# Patient Record
Sex: Female | Born: 1940
Health system: Southern US, Community
[De-identification: ages and names within clinical notes are randomized; demographics above are authoritative.]

## PROBLEM LIST (undated history)

## (undated) ENCOUNTER — Emergency Department (HOSPITAL_COMMUNITY): Payer: Medicare PPO | Source: Home / Self Care

## (undated) DIAGNOSIS — I10 Essential (primary) hypertension: Secondary | ICD-10-CM

## (undated) HISTORY — PX: CHOLECYSTECTOMY: SHX55

## (undated) HISTORY — PX: BACK SURGERY: SHX140

## (undated) HISTORY — PX: ABDOMINAL HYSTERECTOMY: SHX81

---

## 1999-02-25 ENCOUNTER — Encounter: Payer: Self-pay | Admitting: Orthopedic Surgery

## 1999-02-28 ENCOUNTER — Ambulatory Visit (HOSPITAL_COMMUNITY): Admission: RE | Admit: 1999-02-28 | Discharge: 1999-02-28 | Payer: Self-pay | Admitting: Orthopedic Surgery

## 2001-07-29 ENCOUNTER — Ambulatory Visit (HOSPITAL_COMMUNITY): Admission: RE | Admit: 2001-07-29 | Discharge: 2001-07-29 | Payer: Self-pay | Admitting: Gastroenterology

## 2003-08-10 ENCOUNTER — Encounter: Admission: RE | Admit: 2003-08-10 | Discharge: 2003-08-10 | Payer: Self-pay | Admitting: Emergency Medicine

## 2010-12-10 ENCOUNTER — Other Ambulatory Visit: Payer: Self-pay | Admitting: Dermatology

## 2011-08-08 ENCOUNTER — Other Ambulatory Visit: Payer: Self-pay | Admitting: Gastroenterology

## 2013-12-23 ENCOUNTER — Other Ambulatory Visit: Payer: Self-pay | Admitting: Dermatology

## 2014-02-28 ENCOUNTER — Other Ambulatory Visit: Payer: Self-pay | Admitting: Dermatology

## 2014-07-04 ENCOUNTER — Other Ambulatory Visit: Payer: Self-pay | Admitting: Family Medicine

## 2014-07-04 DIAGNOSIS — R1084 Generalized abdominal pain: Secondary | ICD-10-CM

## 2014-07-04 DIAGNOSIS — K5790 Diverticulosis of intestine, part unspecified, without perforation or abscess without bleeding: Secondary | ICD-10-CM

## 2014-07-07 ENCOUNTER — Ambulatory Visit
Admission: RE | Admit: 2014-07-07 | Discharge: 2014-07-07 | Disposition: A | Discharge status: Home / Self Care | Payer: BC Managed Care – PPO | Source: Ambulatory Visit | Attending: Family Medicine | Admitting: Family Medicine

## 2014-07-07 DIAGNOSIS — K5790 Diverticulosis of intestine, part unspecified, without perforation or abscess without bleeding: Secondary | ICD-10-CM

## 2014-07-07 DIAGNOSIS — R1084 Generalized abdominal pain: Secondary | ICD-10-CM

## 2016-03-20 DIAGNOSIS — H02021 Mechanical entropion of right upper eyelid: Secondary | ICD-10-CM | POA: Diagnosis not present

## 2016-03-20 DIAGNOSIS — H02051 Trichiasis without entropian right upper eyelid: Secondary | ICD-10-CM | POA: Diagnosis not present

## 2016-03-20 DIAGNOSIS — H04561 Stenosis of right lacrimal punctum: Secondary | ICD-10-CM | POA: Diagnosis not present

## 2017-03-24 DIAGNOSIS — E2839 Other primary ovarian failure: Secondary | ICD-10-CM | POA: Diagnosis not present

## 2017-11-17 DIAGNOSIS — Z23 Encounter for immunization: Secondary | ICD-10-CM | POA: Diagnosis not present

## 2017-11-24 DIAGNOSIS — J309 Allergic rhinitis, unspecified: Secondary | ICD-10-CM | POA: Diagnosis not present

## 2017-12-18 DIAGNOSIS — L309 Dermatitis, unspecified: Secondary | ICD-10-CM | POA: Diagnosis not present

## 2017-12-18 DIAGNOSIS — J309 Allergic rhinitis, unspecified: Secondary | ICD-10-CM | POA: Diagnosis not present

## 2017-12-18 DIAGNOSIS — R5383 Other fatigue: Secondary | ICD-10-CM | POA: Diagnosis not present

## 2017-12-18 DIAGNOSIS — R197 Diarrhea, unspecified: Secondary | ICD-10-CM | POA: Diagnosis not present

## 2017-12-31 DIAGNOSIS — E7800 Pure hypercholesterolemia, unspecified: Secondary | ICD-10-CM | POA: Diagnosis not present

## 2017-12-31 DIAGNOSIS — Z79899 Other long term (current) drug therapy: Secondary | ICD-10-CM | POA: Diagnosis not present

## 2017-12-31 DIAGNOSIS — I890 Lymphedema, not elsewhere classified: Secondary | ICD-10-CM | POA: Diagnosis not present

## 2017-12-31 DIAGNOSIS — N183 Chronic kidney disease, stage 3 (moderate): Secondary | ICD-10-CM | POA: Diagnosis not present

## 2017-12-31 DIAGNOSIS — Z1211 Encounter for screening for malignant neoplasm of colon: Secondary | ICD-10-CM | POA: Diagnosis not present

## 2017-12-31 DIAGNOSIS — I2510 Atherosclerotic heart disease of native coronary artery without angina pectoris: Secondary | ICD-10-CM | POA: Diagnosis not present

## 2017-12-31 DIAGNOSIS — I10 Essential (primary) hypertension: Secondary | ICD-10-CM | POA: Diagnosis not present

## 2017-12-31 DIAGNOSIS — M109 Gout, unspecified: Secondary | ICD-10-CM | POA: Diagnosis not present

## 2017-12-31 DIAGNOSIS — I700 Atherosclerosis of aorta: Secondary | ICD-10-CM | POA: Diagnosis not present

## 2017-12-31 DIAGNOSIS — Z0000 Encounter for general adult medical examination without abnormal findings: Secondary | ICD-10-CM | POA: Diagnosis not present

## 2017-12-31 DIAGNOSIS — Z23 Encounter for immunization: Secondary | ICD-10-CM | POA: Diagnosis not present

## 2017-12-31 DIAGNOSIS — C4491 Basal cell carcinoma of skin, unspecified: Secondary | ICD-10-CM | POA: Diagnosis not present

## 2017-12-31 DIAGNOSIS — D692 Other nonthrombocytopenic purpura: Secondary | ICD-10-CM | POA: Diagnosis not present

## 2018-01-01 DIAGNOSIS — D1721 Benign lipomatous neoplasm of skin and subcutaneous tissue of right arm: Secondary | ICD-10-CM | POA: Diagnosis not present

## 2018-01-01 DIAGNOSIS — Z85828 Personal history of other malignant neoplasm of skin: Secondary | ICD-10-CM | POA: Diagnosis not present

## 2018-01-01 DIAGNOSIS — D2261 Melanocytic nevi of right upper limb, including shoulder: Secondary | ICD-10-CM | POA: Diagnosis not present

## 2018-01-01 DIAGNOSIS — D1722 Benign lipomatous neoplasm of skin and subcutaneous tissue of left arm: Secondary | ICD-10-CM | POA: Diagnosis not present

## 2018-01-01 DIAGNOSIS — D1801 Hemangioma of skin and subcutaneous tissue: Secondary | ICD-10-CM | POA: Diagnosis not present

## 2018-01-01 DIAGNOSIS — L570 Actinic keratosis: Secondary | ICD-10-CM | POA: Diagnosis not present

## 2018-01-01 DIAGNOSIS — D2262 Melanocytic nevi of left upper limb, including shoulder: Secondary | ICD-10-CM | POA: Diagnosis not present

## 2018-01-01 DIAGNOSIS — L821 Other seborrheic keratosis: Secondary | ICD-10-CM | POA: Diagnosis not present

## 2018-01-18 DIAGNOSIS — I10 Essential (primary) hypertension: Secondary | ICD-10-CM | POA: Diagnosis not present

## 2018-08-06 ENCOUNTER — Telehealth (HOSPITAL_COMMUNITY): Payer: Self-pay | Admitting: Emergency Medicine

## 2018-08-06 ENCOUNTER — Ambulatory Visit (HOSPITAL_COMMUNITY)
Admission: EM | Admit: 2018-08-06 | Discharge: 2018-08-06 | Disposition: A | Discharge status: Home / Self Care | Payer: Medicare Other | Attending: Internal Medicine | Admitting: Internal Medicine

## 2018-08-06 ENCOUNTER — Encounter (HOSPITAL_COMMUNITY): Payer: Self-pay

## 2018-08-06 ENCOUNTER — Other Ambulatory Visit: Payer: Self-pay

## 2018-08-06 DIAGNOSIS — H9313 Tinnitus, bilateral: Secondary | ICD-10-CM | POA: Diagnosis not present

## 2018-08-06 HISTORY — DX: Essential (primary) hypertension: I10

## 2018-08-06 MED ORDER — GUAIFENESIN ER 600 MG PO TB12: 1200.0000 mg | ORAL_TABLET | Freq: Two times a day (BID) | ORAL | 0 refills | Status: DC | PRN

## 2018-08-06 MED ORDER — FLUTICASONE PROPIONATE 50 MCG/ACT NA SUSP: 1.0000 | Freq: Every day | NASAL | 2 refills | Status: DC

## 2018-08-06 MED ORDER — GUAIFENESIN ER 600 MG PO TB12: 1200.0000 mg | ORAL_TABLET | Freq: Two times a day (BID) | ORAL | 0 refills | Status: AC | PRN

## 2018-08-06 NOTE — Telephone Encounter (Signed)
PT wants meds sent to [address]

## 2018-08-06 NOTE — ED Provider Notes (Signed)
[facility]    CSN: [id number] Arrival date & time: 08/06/18  1554      History   Chief Complaint Chief Complaint  Patient presents with  . Tinnitus    HPI Elizabeth Beard is a 78 y.o. female.   Elizabeth Beard presents with complaints of "buzzing" "ring" to her head. States last week 6/18 saw her PCP for congestion with ear pressure, eyes watering, nasal drainage, facial pressure and the same buzzing. Was prescribed antibiotics which helped with her symptoms. She was feeling better. This morning the buzzing returned. No further nasal or eye drainage. No ear pain. States sometimes she can cough up/produce phlegm but no specific cough. No fevers. No known ill contacts. She resides alone. No dizziness or weakness. No confusion. No numbness or tingling. No headache. No neck pain. No vision changes. Still on the antibiotics, tomorrow will be the last day. Denies any previous similar. Hx of htn.    ROS per HPI, negative if not otherwise mentioned.      Past Medical History:  Diagnosis Date  . Hypertension     There are no active problems to display for this patient.   Past Surgical History:  Procedure Laterality Date  . ABDOMINAL HYSTERECTOMY    . CHOLECYSTECTOMY      OB History   No obstetric history on file.      Home Medications    Prior to Admission medications   Medication Sig Start Date End Date Taking? Authorizing Provider  allopurinol (ZYLOPRIM) 300 MG tablet Take 300 mg by mouth daily.   Yes [provider]  amLODipine (NORVASC) 5 MG tablet Take 5 mg by mouth daily.   Yes [provider]  aspirin EC 81 MG tablet Take 81 mg by mouth daily.   Yes [provider]  CALCIUM CITRATE-VITAMIN D PO Take 1 tablet by mouth 2 (two) times daily.   Yes [provider]  furosemide (LASIX) 40 MG tablet Take 40 mg by mouth.   Yes [provider]  labetalol (NORMODYNE) 300 MG tablet Take 300 mg by mouth 2 (two) times  daily.   Yes [provider]  potassium chloride SA (K-DUR) 20 MEQ tablet Take 20 mEq by mouth 2 (two) times daily.   Yes [provider]  valsartan-hydrochlorothiazide (DIOVAN-HCT) 320-25 MG tablet Take 1 tablet by mouth daily.   Yes [provider]  fluticasone (FLONASE) 50 MCG/ACT nasal spray Place 1 spray into both nostrils daily. 08/06/18   Zigmund Gottron, NP  guaiFENesin (MUCINEX) 600 MG 12 hr tablet Take 2 tablets (1,200 mg total) by mouth 2 (two) times daily as needed for up to 5 days. 08/06/18 08/11/18  Zigmund Gottron, NP    Family History Family History  Problem Relation Age of Onset  . Cancer Mother   . Heart failure Father     Social History Social History   Tobacco Use  . Smoking status: Never Smoker  . Smokeless tobacco: Never Used  Substance Use Topics  . Alcohol use: Not Currently  . Drug use: Never     Allergies   Patient has no known allergies.   Review of Systems Review of Systems   Physical Exam Triage Vital Signs ED Triage Vitals  Enc Vitals Group     BP 08/06/18 1645 127/76     Pulse Rate 08/06/18 1645 78     Resp 08/06/18 1645 18     Temp 08/06/18 1645 98.4 F (36.9 C)  Temp Source 08/06/18 1645 Oral     SpO2 08/06/18 1645 100 %     Weight --      Height --      Head Circumference --      Peak Flow --      Pain Score 08/06/18 1637 2     Pain Loc --      Pain Edu? --      Excl. in [place]? --    No data found.  Updated Vital Signs BP 127/76 (BP Location: Right Arm)   Pulse 78   Temp 98.4 F (36.9 C) (Oral)   Resp 18   SpO2 100%    Physical Exam Constitutional:      General: She is not in acute distress.    Appearance: She is well-developed.  HENT:     Head: Normocephalic and atraumatic.     Ears:     Comments: Tm's dull bilaterally     Nose: Nose normal.     Mouth/Throat:     Lips: Pink.     Mouth: Mucous membranes are moist.  Neck:     Musculoskeletal: No edema, neck rigidity or pain with  movement.     Vascular: No carotid bruit or JVD.  Cardiovascular:     Rate and Rhythm: Normal rate and regular rhythm.     Heart sounds: Normal heart sounds.  Pulmonary:     Effort: Pulmonary effort is normal.     Breath sounds: Normal breath sounds.  Skin:    General: Skin is warm and dry.  Neurological:     General: No focal deficit present.     Mental Status: She is alert and oriented to person, place, and time.     GCS: GCS eye subscore is 4. GCS verbal subscore is 5. GCS motor subscore is 6.     Cranial Nerves: Cranial nerves are intact.     Sensory: Sensation is intact.     Motor: Motor function is intact.     Coordination: Coordination is intact.     Gait: Gait is intact.      UC Treatments / Results  Labs (all labs ordered are listed, but only abnormal results are displayed) Labs Reviewed - No data to display  EKG None  Radiology No results found.  Procedures Procedures (including critical care time)  Medications Ordered in UC Medications - No data to display  Initial Impression / Assessment and Plan / UC Course  I have reviewed the triage vital signs and the nursing notes.  Pertinent labs & imaging results that were available during my care of the patient were reviewed by me and considered in my medical decision making (see chart for details).     No red flag findings on exam. Vitals stable. Neurologically intact. Symptoms have been improving with sinusitis treatment, until today. Hasn't completed course yet. Will add in daily flonase as well as mucinex to try to further improve what sounds like eustachian tube dysfunction likely? Continue to follow with PCP for persistent symptoms. Return precautions provided. Patient verbalized understanding and agreeable to plan.  Ambulatory out of clinic without difficulty.    Final Clinical Impressions(s) / UC Diagnoses   Final diagnoses:  Tinnitus aurium, bilateral     Discharge Instructions     Your exam here  today is overall reassuring.  Complete antibiotics prescribed by your physician.  We will try to further treat congestion to try to improve your symptoms.  May use a mucinex to  loosen secretions.  Daily nasal spray will target the sinuses and ear canals as well to hopefully help with symptoms.  Plenty of water.  Continue to follow with your PCP for persistent symptoms.  If any worsening of symptoms: vision changes, dizziness, headache, weakness, numbness, tingling, confusion or otherwise worsening please return or go to the ER.    ED Prescriptions    Medication Sig Dispense Auth. Provider   guaiFENesin (MUCINEX) 600 MG 12 hr tablet Take 2 tablets (1,200 mg total) by mouth 2 (two) times daily as needed for up to 5 days. 20 tablet Augusto Gamble B, NP   fluticasone (FLONASE) 50 MCG/ACT nasal spray Place 1 spray into both nostrils daily. 16 g Zigmund Gottron, NP     Controlled Substance Prescriptions [place] Controlled Substance Registry consulted? Not Applicable   Zigmund Gottron, NP 08/06/18 2002

## 2018-08-06 NOTE — Discharge Instructions (Signed)
Your exam here today is overall reassuring.  Complete antibiotics prescribed by your physician.  We will try to further treat congestion to try to improve your symptoms.  May use a mucinex to loosen secretions.  Daily nasal spray will target the sinuses and ear canals as well to hopefully help with symptoms.  Plenty of water.  Continue to follow with your PCP for persistent symptoms.  If any worsening of symptoms: vision changes, dizziness, headache, weakness, numbness, tingling, confusion or otherwise worsening please return or go to the ER.

## 2018-08-06 NOTE — ED Triage Notes (Signed)
Patient presents to Urgent Care with complaints of consistent ringing in her ears and pain in her face since last week. Patient reports she saw her PCP last week and was diagnosed with sinusitis, headache, and tinnitus and given amoxicillin (still has 2 more doses to take). Pt tried to get in with PCP today but they could not get her in, was told to come here.

## 2019-01-07 ENCOUNTER — Other Ambulatory Visit: Payer: Self-pay

## 2019-01-07 ENCOUNTER — Emergency Department (HOSPITAL_COMMUNITY)
Admission: EM | Admit: 2019-01-07 | Discharge: 2019-01-07 | Disposition: A | Discharge status: Home / Self Care | Payer: Medicare Other | Attending: Emergency Medicine | Admitting: Emergency Medicine

## 2019-01-07 DIAGNOSIS — I10 Essential (primary) hypertension: Secondary | ICD-10-CM | POA: Diagnosis not present

## 2019-01-07 DIAGNOSIS — Y33XXXA Other specified events, undetermined intent, initial encounter: Secondary | ICD-10-CM | POA: Diagnosis not present

## 2019-01-07 DIAGNOSIS — Y999 Unspecified external cause status: Secondary | ICD-10-CM | POA: Insufficient documentation

## 2019-01-07 DIAGNOSIS — Y9289 Other specified places as the place of occurrence of the external cause: Secondary | ICD-10-CM | POA: Insufficient documentation

## 2019-01-07 DIAGNOSIS — Y9389 Activity, other specified: Secondary | ICD-10-CM | POA: Diagnosis not present

## 2019-01-07 DIAGNOSIS — T18128A Food in esophagus causing other injury, initial encounter: Secondary | ICD-10-CM | POA: Diagnosis not present

## 2019-01-07 DIAGNOSIS — Z79899 Other long term (current) drug therapy: Secondary | ICD-10-CM | POA: Diagnosis not present

## 2019-01-07 NOTE — ED Triage Notes (Signed)
Pt presents to ED via [facility] coming from home. Pt was eating left over thanksgiving dinner and became choked on her [place]. She still feels like its stuck in her throat. Pt has no obvious airway problems and denies any ShOB. Pt is CA&Ox4 and ambulatory.

## 2019-01-07 NOTE — ED Provider Notes (Signed)
[facility] DEPT Provider Note   CSN: [id number] Arrival date & time: 01/07/19  1940     History   Chief Complaint Chief Complaint  Patient presents with  . Choking    [place] stuck in throat    HPI Elizabeth Beard is a 78 y.o. female.     HPI  78 year old female presents after getting [place] stuck in her throat.  She was eating [place] around 7 PM and felt it suddenly stuck.  Felt like she was able to get a small piece out but otherwise still feels like it is stuck in her throat.  No vomiting or trouble breathing but she cannot swallow.  There is no chest pain.  No recent illness.  This is never happened to her before.  Past Medical History:  Diagnosis Date  . Hypertension     There are no active problems to display for this patient.   Past Surgical History:  Procedure Laterality Date  . ABDOMINAL HYSTERECTOMY    . CHOLECYSTECTOMY       OB History   No obstetric history on file.      Home Medications    Prior to Admission medications   Medication Sig Start Date End Date Taking? Authorizing Provider  allopurinol (ZYLOPRIM) 300 MG tablet Take 300 mg by mouth daily.   Yes [provider]  amLODipine (NORVASC) 5 MG tablet Take 5 mg by mouth daily.   Yes [provider]  aspirin EC 81 MG tablet Take 81 mg by mouth daily.   Yes [provider]  CALCIUM CITRATE-VITAMIN D PO Take 1 tablet by mouth 2 (two) times daily.   Yes [provider]  fluticasone (FLONASE) 50 MCG/ACT nasal spray Place 1 spray into both nostrils daily. 08/06/18  Yes Burky, Lanelle Bal B, NP  furosemide (LASIX) 40 MG tablet Take 40 mg by mouth.   Yes [provider]  labetalol (NORMODYNE) 300 MG tablet Take 300 mg by mouth 2 (two) times daily.   Yes [provider]  potassium chloride SA (K-DUR) 20 MEQ tablet Take 20 mEq by mouth 2 (two) times daily.   Yes [provider]  valsartan-hydrochlorothiazide  (DIOVAN-HCT) 320-25 MG tablet Take 1 tablet by mouth daily.   Yes [provider]    Family History Family History  Problem Relation Age of Onset  . Cancer Mother   . Heart failure Father     Social History Social History   Tobacco Use  . Smoking status: Never Smoker  . Smokeless tobacco: Never Used  Substance Use Topics  . Alcohol use: Not Currently  . Drug use: Never     Allergies   Patient has no known allergies.   Review of Systems Review of Systems  HENT: Positive for trouble swallowing.   Respiratory: Negative for shortness of breath.   Cardiovascular: Negative for chest pain.  Gastrointestinal: Positive for vomiting. Negative for abdominal pain.  All other systems reviewed and are negative.    Physical Exam Updated Vital Signs BP 101/77   Pulse 72   Temp 98 F (36.7 C) (Oral)   Resp 16   Ht 5\' 2"  (1.575 m)   Wt 68 kg   SpO2 98%   BMI 27.44 kg/m   Physical Exam Vitals signs and nursing note reviewed.  Constitutional:      Appearance: She is well-developed.     Comments: Patient is awake, alert, no acute distress. No drooling or trouble breathing/speaking She vomits immediately  after trying to drink a sip of water  HENT:     Head: Normocephalic and atraumatic.     Right Ear: External ear normal.     Left Ear: External ear normal.     Nose: Nose normal.     Mouth/Throat:     Pharynx: Oropharynx is clear. No oropharyngeal exudate or posterior oropharyngeal erythema.  Eyes:     General:        Right eye: No discharge.        Left eye: No discharge.  Cardiovascular:     Rate and Rhythm: Normal rate and regular rhythm.     Heart sounds: Normal heart sounds.  Pulmonary:     Effort: Pulmonary effort is normal.     Breath sounds: Normal breath sounds.  Abdominal:     Palpations: Abdomen is soft.     Tenderness: There is no abdominal tenderness.  Skin:    General: Skin is warm and dry.  Neurological:     Mental Status: She is alert.   Psychiatric:        Mood and Affect: Mood is not anxious.      ED Treatments / Results  Labs (all labs ordered are listed, but only abnormal results are displayed) Labs Reviewed - No data to display  EKG None  Radiology No results found.  Procedures Procedures (including critical care time)  Medications Ordered in ED Medications - No data to display   Initial Impression / Assessment and Plan / ED Course  I have reviewed the triage vital signs and the nursing notes.  Pertinent labs & imaging results that were available during my care of the patient were reviewed by me and considered in my medical decision making (see chart for details).        Patient presents with food impaction from [place].  Discussed with Dr. Watt Climes, original plan for endoscopy after Covid testing.  However right before she was going to get swabbed, the patient felt a sensation of passing the blockage.  She is now able to drink water and was briefly observed with no emesis or uncomfortable sensation.  Appears she has passed her impaction.  We will have her follow-up with GI as an outpatient but she otherwise appears stable for discharge home.  Final Clinical Impressions(s) / ED Diagnoses   Final diagnoses:  Food impaction of esophagus, initial encounter    ED Discharge Orders    None       Sherwood Gambler, MD 01/07/19 2126

## 2019-01-07 NOTE — Discharge Instructions (Signed)
For now, be careful with eating and you should eat soft food.  Follow-up with gastroenterology.  If you develop recurrent feeling that food is stuck or cannot swallow fluids, you should return to the ER for evaluation.

## 2019-01-18 DIAGNOSIS — Z85828 Personal history of other malignant neoplasm of skin: Secondary | ICD-10-CM | POA: Diagnosis not present

## 2019-01-18 DIAGNOSIS — D1801 Hemangioma of skin and subcutaneous tissue: Secondary | ICD-10-CM | POA: Diagnosis not present

## 2019-01-18 DIAGNOSIS — D225 Melanocytic nevi of trunk: Secondary | ICD-10-CM | POA: Diagnosis not present

## 2019-01-18 DIAGNOSIS — D2262 Melanocytic nevi of left upper limb, including shoulder: Secondary | ICD-10-CM | POA: Diagnosis not present

## 2019-01-18 DIAGNOSIS — L820 Inflamed seborrheic keratosis: Secondary | ICD-10-CM | POA: Diagnosis not present

## 2019-01-18 DIAGNOSIS — L821 Other seborrheic keratosis: Secondary | ICD-10-CM | POA: Diagnosis not present

## 2019-01-18 DIAGNOSIS — D2371 Other benign neoplasm of skin of right lower limb, including hip: Secondary | ICD-10-CM | POA: Diagnosis not present

## 2019-01-18 DIAGNOSIS — L814 Other melanin hyperpigmentation: Secondary | ICD-10-CM | POA: Diagnosis not present

## 2019-01-18 DIAGNOSIS — L570 Actinic keratosis: Secondary | ICD-10-CM | POA: Diagnosis not present

## 2019-06-27 DIAGNOSIS — L821 Other seborrheic keratosis: Secondary | ICD-10-CM | POA: Diagnosis not present

## 2019-06-27 DIAGNOSIS — D179 Benign lipomatous neoplasm, unspecified: Secondary | ICD-10-CM | POA: Diagnosis not present

## 2019-07-04 DIAGNOSIS — H02831 Dermatochalasis of right upper eyelid: Secondary | ICD-10-CM | POA: Diagnosis not present

## 2019-07-04 DIAGNOSIS — H2513 Age-related nuclear cataract, bilateral: Secondary | ICD-10-CM | POA: Diagnosis not present

## 2019-07-04 DIAGNOSIS — H02834 Dermatochalasis of left upper eyelid: Secondary | ICD-10-CM | POA: Diagnosis not present

## 2019-07-04 DIAGNOSIS — H5703 Miosis: Secondary | ICD-10-CM | POA: Diagnosis not present

## 2019-07-12 DIAGNOSIS — D225 Melanocytic nevi of trunk: Secondary | ICD-10-CM | POA: Diagnosis not present

## 2019-07-12 DIAGNOSIS — Z85828 Personal history of other malignant neoplasm of skin: Secondary | ICD-10-CM | POA: Diagnosis not present

## 2019-07-12 DIAGNOSIS — L821 Other seborrheic keratosis: Secondary | ICD-10-CM | POA: Diagnosis not present

## 2019-09-20 DIAGNOSIS — N6320 Unspecified lump in the left breast, unspecified quadrant: Secondary | ICD-10-CM | POA: Diagnosis not present

## 2019-09-20 DIAGNOSIS — L821 Other seborrheic keratosis: Secondary | ICD-10-CM | POA: Diagnosis not present

## 2019-09-20 DIAGNOSIS — D179 Benign lipomatous neoplasm, unspecified: Secondary | ICD-10-CM | POA: Diagnosis not present

## 2019-09-25 DIAGNOSIS — H9313 Tinnitus, bilateral: Secondary | ICD-10-CM | POA: Diagnosis not present

## 2019-10-04 ENCOUNTER — Emergency Department (HOSPITAL_COMMUNITY)
Admission: EM | Admit: 2019-10-04 | Discharge: 2019-10-04 | Disposition: A | Discharge status: Home / Self Care | Payer: Medicare PPO | Attending: Emergency Medicine | Admitting: Emergency Medicine

## 2019-10-04 ENCOUNTER — Other Ambulatory Visit: Payer: Self-pay

## 2019-10-04 ENCOUNTER — Encounter (HOSPITAL_COMMUNITY): Payer: Self-pay

## 2019-10-04 DIAGNOSIS — I10 Essential (primary) hypertension: Secondary | ICD-10-CM | POA: Diagnosis not present

## 2019-10-04 DIAGNOSIS — Z79899 Other long term (current) drug therapy: Secondary | ICD-10-CM | POA: Diagnosis not present

## 2019-10-04 DIAGNOSIS — Z7982 Long term (current) use of aspirin: Secondary | ICD-10-CM | POA: Diagnosis not present

## 2019-10-04 DIAGNOSIS — H9313 Tinnitus, bilateral: Secondary | ICD-10-CM | POA: Diagnosis not present

## 2019-10-04 NOTE — ED Triage Notes (Signed)
Pt presents with c/o ringing and buzzing in her ear. Pt reports she has been seen for same but that the buzzing has gotten worse. Pt reports mild dizziness.

## 2019-10-04 NOTE — ED Provider Notes (Signed)
[facility] DEPT Provider Note   CSN: [id number] Arrival date & time: 10/04/19  0744     History Chief Complaint  Patient presents with  . Tinnitus    Elizabeth Beard is a 79 y.o. female.  79 year old female with history of hypertension who presents with ringing in ears.  Patient has history of tinnitus which she describes as a buzzing in her ears for which she has seen ENT last year.  Was diagnosed with b/l SNHL and offered the option of hearing aids but she states that her hearing has been okay so she has not gotten them.  This morning she was awakened from sleep with a buzzing in bilateral ears that was the same quality as it usually is but seemed louder/more intense than usual and radiated up to the top of her head.  She denies associated headache, vertigo, nausea/vomiting, vision changes, extremity weakness/numbness, or balance problems.  Episode lasted approximately 30 minutes and has now subsided.  She denies any ear pain or sudden hearing loss.  She reports that she actually has an appointment scheduled with ENT in 2 days.  The history is provided by the patient.       Past Medical History:  Diagnosis Date  . Hypertension     There are no problems to display for this patient.   Past Surgical History:  Procedure Laterality Date  . ABDOMINAL HYSTERECTOMY    . CHOLECYSTECTOMY       OB History   No obstetric history on file.     Family History  Problem Relation Age of Onset  . Cancer Mother   . Heart failure Father     Social History   Tobacco Use  . Smoking status: Never Smoker  . Smokeless tobacco: Never Used  Vaping Use  . Vaping Use: Never used  Substance Use Topics  . Alcohol use: Not Currently  . Drug use: Never    Home Medications Prior to Admission medications   Medication Sig Start Date End Date Taking? Authorizing Provider  allopurinol (ZYLOPRIM) 300 MG tablet Take 300 mg by mouth daily.    [provider]  amLODipine (NORVASC) 5 MG tablet Take 5 mg by mouth daily.    [provider]  aspirin EC 81 MG tablet Take 81 mg by mouth daily.    [provider]  CALCIUM CITRATE-VITAMIN D PO Take 1 tablet by mouth 2 (two) times daily.    [provider]  fluticasone (FLONASE) 50 MCG/ACT nasal spray Place 1 spray into both nostrils daily. 08/06/18   Zigmund Gottron, NP  furosemide (LASIX) 40 MG tablet Take 40 mg by mouth.    [provider]  labetalol (NORMODYNE) 300 MG tablet Take 300 mg by mouth 2 (two) times daily.    [provider]  potassium chloride SA (K-DUR) 20 MEQ tablet Take 20 mEq by mouth 2 (two) times daily.    [provider]  valsartan-hydrochlorothiazide (DIOVAN-HCT) 320-25 MG tablet Take 1 tablet by mouth daily.    [provider]    Allergies    Patient has no known allergies.  Review of Systems   Review of Systems All other systems reviewed and are negative except that which was mentioned in HPI  Physical Exam Updated Vital Signs BP (!) 147/94 (BP Location: Right Arm)   Pulse 75   Temp 97.9 F (36.6 C) (Oral)   Resp 16   Ht 5\' 1"  (1.549 m)   Wt  66.7 kg   SpO2 100%   BMI 27.78 kg/m   Physical Exam Vitals and nursing note reviewed.  Constitutional:      General: She is not in acute distress.    Appearance: She is well-developed.     Comments: Awake, alert  HENT:     Head: Normocephalic and atraumatic.     Right Ear: Tympanic membrane and ear canal normal.     Left Ear: Tympanic membrane and ear canal normal.  Eyes:     Extraocular Movements: Extraocular movements intact.     Conjunctiva/sclera: Conjunctivae normal.     Pupils: Pupils are equal, round, and reactive to light.  Pulmonary:     Effort: Pulmonary effort is normal.  Musculoskeletal:     Cervical back: Neck supple.  Skin:    General: Skin is warm and dry.  Neurological:     Mental Status: She is alert and oriented to  person, place, and time.     Cranial Nerves: No cranial nerve deficit.     Motor: No weakness or abnormal muscle tone.     Deep Tendon Reflexes: Reflexes are normal and symmetric. Reflexes normal.     Comments: Fluent speech, normal finger-to-nose testing, negative pronator drift 5/5 strength and normal sensation x all 4 extremities  Psychiatric:        Thought Content: Thought content normal.        Judgment: Judgment normal.     ED Results / Procedures / Treatments   Labs (all labs ordered are listed, but only abnormal results are displayed) Labs Reviewed - No data to display  EKG None  Radiology No results found.  Procedures Procedures (including critical care time)  Medications Ordered in ED Medications - No data to display  ED Course  I have reviewed the triage vital signs and the nursing notes.      MDM Rules/Calculators/A&P                          Well-appearing and normal neurologic exam.  Currently not having symptoms.  I reviewed her chart including ENT and audiology evaluations from last year for the same symptoms.  She has no new symptoms/red flag symptoms to suggest central process such as dissection.  I have encouraged her to follow-up with ENT and have reviewed return precautions.  She voiced understanding. Final Clinical Impression(s) / ED Diagnoses Final diagnoses:  Tinnitus of both ears    Rx / DC Orders ED Discharge Orders    None       Kiyoto Slomski, Wenda Overland, MD 10/04/19 1012

## 2020-01-18 DIAGNOSIS — B078 Other viral warts: Secondary | ICD-10-CM | POA: Diagnosis not present

## 2020-01-18 DIAGNOSIS — Z85828 Personal history of other malignant neoplasm of skin: Secondary | ICD-10-CM | POA: Diagnosis not present

## 2020-01-18 DIAGNOSIS — L814 Other melanin hyperpigmentation: Secondary | ICD-10-CM | POA: Diagnosis not present

## 2020-01-18 DIAGNOSIS — D485 Neoplasm of uncertain behavior of skin: Secondary | ICD-10-CM | POA: Diagnosis not present

## 2020-01-18 DIAGNOSIS — D225 Melanocytic nevi of trunk: Secondary | ICD-10-CM | POA: Diagnosis not present

## 2020-01-18 DIAGNOSIS — D2371 Other benign neoplasm of skin of right lower limb, including hip: Secondary | ICD-10-CM | POA: Diagnosis not present

## 2020-01-18 DIAGNOSIS — L821 Other seborrheic keratosis: Secondary | ICD-10-CM | POA: Diagnosis not present

## 2020-03-13 DIAGNOSIS — M109 Gout, unspecified: Secondary | ICD-10-CM | POA: Diagnosis not present

## 2020-03-13 DIAGNOSIS — E2839 Other primary ovarian failure: Secondary | ICD-10-CM | POA: Diagnosis not present

## 2020-03-13 DIAGNOSIS — C4491 Basal cell carcinoma of skin, unspecified: Secondary | ICD-10-CM | POA: Diagnosis not present

## 2020-03-13 DIAGNOSIS — Z23 Encounter for immunization: Secondary | ICD-10-CM | POA: Diagnosis not present

## 2020-03-13 DIAGNOSIS — Z79899 Other long term (current) drug therapy: Secondary | ICD-10-CM | POA: Diagnosis not present

## 2020-03-13 DIAGNOSIS — E7800 Pure hypercholesterolemia, unspecified: Secondary | ICD-10-CM | POA: Diagnosis not present

## 2020-03-13 DIAGNOSIS — Z0000 Encounter for general adult medical examination without abnormal findings: Secondary | ICD-10-CM | POA: Diagnosis not present

## 2020-03-13 DIAGNOSIS — N183 Chronic kidney disease, stage 3 unspecified: Secondary | ICD-10-CM | POA: Diagnosis not present

## 2020-03-13 DIAGNOSIS — I10 Essential (primary) hypertension: Secondary | ICD-10-CM | POA: Diagnosis not present

## 2020-03-26 DIAGNOSIS — Z1231 Encounter for screening mammogram for malignant neoplasm of breast: Secondary | ICD-10-CM | POA: Diagnosis not present

## 2020-03-26 DIAGNOSIS — Z780 Asymptomatic menopausal state: Secondary | ICD-10-CM | POA: Diagnosis not present

## 2020-05-03 DIAGNOSIS — S60450A Superficial foreign body of right index finger, initial encounter: Secondary | ICD-10-CM | POA: Diagnosis not present

## 2020-05-03 DIAGNOSIS — Y93H2 Activity, gardening and landscaping: Secondary | ICD-10-CM | POA: Diagnosis not present

## 2020-05-08 DIAGNOSIS — L821 Other seborrheic keratosis: Secondary | ICD-10-CM | POA: Diagnosis not present

## 2020-05-08 DIAGNOSIS — I781 Nevus, non-neoplastic: Secondary | ICD-10-CM | POA: Diagnosis not present

## 2020-05-08 DIAGNOSIS — D1801 Hemangioma of skin and subcutaneous tissue: Secondary | ICD-10-CM | POA: Diagnosis not present

## 2020-07-04 DIAGNOSIS — H2513 Age-related nuclear cataract, bilateral: Secondary | ICD-10-CM | POA: Diagnosis not present

## 2020-07-04 DIAGNOSIS — H5703 Miosis: Secondary | ICD-10-CM | POA: Diagnosis not present

## 2020-07-04 DIAGNOSIS — H04123 Dry eye syndrome of bilateral lacrimal glands: Secondary | ICD-10-CM | POA: Diagnosis not present

## 2020-07-04 DIAGNOSIS — H43393 Other vitreous opacities, bilateral: Secondary | ICD-10-CM | POA: Diagnosis not present

## 2020-07-04 DIAGNOSIS — H02831 Dermatochalasis of right upper eyelid: Secondary | ICD-10-CM | POA: Diagnosis not present

## 2020-07-04 DIAGNOSIS — H02834 Dermatochalasis of left upper eyelid: Secondary | ICD-10-CM | POA: Diagnosis not present

## 2020-09-05 DIAGNOSIS — L299 Pruritus, unspecified: Secondary | ICD-10-CM | POA: Diagnosis not present

## 2020-09-10 DIAGNOSIS — G5702 Lesion of sciatic nerve, left lower limb: Secondary | ICD-10-CM | POA: Diagnosis not present

## 2020-09-10 DIAGNOSIS — M25512 Pain in left shoulder: Secondary | ICD-10-CM | POA: Diagnosis not present

## 2020-09-10 DIAGNOSIS — I10 Essential (primary) hypertension: Secondary | ICD-10-CM | POA: Diagnosis not present

## 2020-09-28 DIAGNOSIS — M722 Plantar fascial fibromatosis: Secondary | ICD-10-CM | POA: Diagnosis not present

## 2020-09-28 DIAGNOSIS — I890 Lymphedema, not elsewhere classified: Secondary | ICD-10-CM | POA: Diagnosis not present

## 2020-11-24 DIAGNOSIS — D1801 Hemangioma of skin and subcutaneous tissue: Secondary | ICD-10-CM | POA: Diagnosis not present

## 2020-11-24 DIAGNOSIS — L570 Actinic keratosis: Secondary | ICD-10-CM | POA: Diagnosis not present

## 2020-11-27 DIAGNOSIS — D1721 Benign lipomatous neoplasm of skin and subcutaneous tissue of right arm: Secondary | ICD-10-CM | POA: Diagnosis not present

## 2020-11-27 DIAGNOSIS — D1722 Benign lipomatous neoplasm of skin and subcutaneous tissue of left arm: Secondary | ICD-10-CM | POA: Diagnosis not present

## 2020-11-27 DIAGNOSIS — D225 Melanocytic nevi of trunk: Secondary | ICD-10-CM | POA: Diagnosis not present

## 2020-11-27 DIAGNOSIS — Z85828 Personal history of other malignant neoplasm of skin: Secondary | ICD-10-CM | POA: Diagnosis not present

## 2020-11-27 DIAGNOSIS — L821 Other seborrheic keratosis: Secondary | ICD-10-CM | POA: Diagnosis not present

## 2020-11-27 DIAGNOSIS — B078 Other viral warts: Secondary | ICD-10-CM | POA: Diagnosis not present

## 2020-12-07 ENCOUNTER — Other Ambulatory Visit: Payer: Self-pay | Admitting: Family Medicine

## 2020-12-07 ENCOUNTER — Other Ambulatory Visit: Payer: Self-pay

## 2020-12-07 ENCOUNTER — Ambulatory Visit
Admission: RE | Admit: 2020-12-07 | Discharge: 2020-12-07 | Disposition: A | Discharge status: Home / Self Care | Payer: Medicare PPO | Source: Ambulatory Visit | Attending: Family Medicine | Admitting: Family Medicine

## 2020-12-07 DIAGNOSIS — L309 Dermatitis, unspecified: Secondary | ICD-10-CM | POA: Diagnosis not present

## 2020-12-07 DIAGNOSIS — R6889 Other general symptoms and signs: Secondary | ICD-10-CM

## 2020-12-07 DIAGNOSIS — R220 Localized swelling, mass and lump, head: Secondary | ICD-10-CM | POA: Diagnosis not present

## 2020-12-07 DIAGNOSIS — R930 Abnormal findings on diagnostic imaging of skull and head, not elsewhere classified: Secondary | ICD-10-CM | POA: Diagnosis not present

## 2020-12-07 DIAGNOSIS — D1720 Benign lipomatous neoplasm of skin and subcutaneous tissue of unspecified limb: Secondary | ICD-10-CM | POA: Diagnosis not present

## 2021-01-17 DIAGNOSIS — D1722 Benign lipomatous neoplasm of skin and subcutaneous tissue of left arm: Secondary | ICD-10-CM | POA: Diagnosis not present

## 2021-01-17 DIAGNOSIS — D1721 Benign lipomatous neoplasm of skin and subcutaneous tissue of right arm: Secondary | ICD-10-CM | POA: Diagnosis not present

## 2021-01-17 DIAGNOSIS — L821 Other seborrheic keratosis: Secondary | ICD-10-CM | POA: Diagnosis not present

## 2021-01-17 DIAGNOSIS — L814 Other melanin hyperpigmentation: Secondary | ICD-10-CM | POA: Diagnosis not present

## 2021-01-17 DIAGNOSIS — D2371 Other benign neoplasm of skin of right lower limb, including hip: Secondary | ICD-10-CM | POA: Diagnosis not present

## 2021-01-17 DIAGNOSIS — Z85828 Personal history of other malignant neoplasm of skin: Secondary | ICD-10-CM | POA: Diagnosis not present

## 2021-03-13 DIAGNOSIS — M109 Gout, unspecified: Secondary | ICD-10-CM | POA: Diagnosis not present

## 2021-03-13 DIAGNOSIS — E559 Vitamin D deficiency, unspecified: Secondary | ICD-10-CM | POA: Diagnosis not present

## 2021-03-13 DIAGNOSIS — Z79899 Other long term (current) drug therapy: Secondary | ICD-10-CM | POA: Diagnosis not present

## 2021-03-13 DIAGNOSIS — R739 Hyperglycemia, unspecified: Secondary | ICD-10-CM | POA: Diagnosis not present

## 2021-03-13 DIAGNOSIS — E7800 Pure hypercholesterolemia, unspecified: Secondary | ICD-10-CM | POA: Diagnosis not present

## 2021-03-13 DIAGNOSIS — E8881 Metabolic syndrome: Secondary | ICD-10-CM | POA: Diagnosis not present

## 2021-03-15 DIAGNOSIS — E559 Vitamin D deficiency, unspecified: Secondary | ICD-10-CM | POA: Diagnosis not present

## 2021-03-15 DIAGNOSIS — Z79899 Other long term (current) drug therapy: Secondary | ICD-10-CM | POA: Diagnosis not present

## 2021-03-15 DIAGNOSIS — I10 Essential (primary) hypertension: Secondary | ICD-10-CM | POA: Diagnosis not present

## 2021-03-15 DIAGNOSIS — E8881 Metabolic syndrome: Secondary | ICD-10-CM | POA: Diagnosis not present

## 2021-03-15 DIAGNOSIS — E7800 Pure hypercholesterolemia, unspecified: Secondary | ICD-10-CM | POA: Diagnosis not present

## 2021-03-15 DIAGNOSIS — I2510 Atherosclerotic heart disease of native coronary artery without angina pectoris: Secondary | ICD-10-CM | POA: Diagnosis not present

## 2021-03-15 DIAGNOSIS — Z1211 Encounter for screening for malignant neoplasm of colon: Secondary | ICD-10-CM | POA: Diagnosis not present

## 2021-03-15 DIAGNOSIS — Z0000 Encounter for general adult medical examination without abnormal findings: Secondary | ICD-10-CM | POA: Diagnosis not present

## 2021-03-15 DIAGNOSIS — M109 Gout, unspecified: Secondary | ICD-10-CM | POA: Diagnosis not present

## 2021-03-18 DIAGNOSIS — Z1211 Encounter for screening for malignant neoplasm of colon: Secondary | ICD-10-CM | POA: Diagnosis not present

## 2021-03-22 DIAGNOSIS — L309 Dermatitis, unspecified: Secondary | ICD-10-CM | POA: Diagnosis not present

## 2021-04-01 DIAGNOSIS — Z1231 Encounter for screening mammogram for malignant neoplasm of breast: Secondary | ICD-10-CM | POA: Diagnosis not present

## 2021-04-27 DIAGNOSIS — R229 Localized swelling, mass and lump, unspecified: Secondary | ICD-10-CM | POA: Diagnosis not present

## 2021-04-27 DIAGNOSIS — T148XXA Other injury of unspecified body region, initial encounter: Secondary | ICD-10-CM | POA: Diagnosis not present

## 2021-06-12 DIAGNOSIS — D1722 Benign lipomatous neoplasm of skin and subcutaneous tissue of left arm: Secondary | ICD-10-CM | POA: Diagnosis not present

## 2021-06-12 DIAGNOSIS — D1721 Benign lipomatous neoplasm of skin and subcutaneous tissue of right arm: Secondary | ICD-10-CM | POA: Diagnosis not present

## 2021-06-12 DIAGNOSIS — Z85828 Personal history of other malignant neoplasm of skin: Secondary | ICD-10-CM | POA: Diagnosis not present

## 2021-06-18 DIAGNOSIS — K5909 Other constipation: Secondary | ICD-10-CM | POA: Diagnosis not present

## 2021-07-10 DIAGNOSIS — H04123 Dry eye syndrome of bilateral lacrimal glands: Secondary | ICD-10-CM | POA: Diagnosis not present

## 2021-07-10 DIAGNOSIS — H02834 Dermatochalasis of left upper eyelid: Secondary | ICD-10-CM | POA: Diagnosis not present

## 2021-07-10 DIAGNOSIS — H02831 Dermatochalasis of right upper eyelid: Secondary | ICD-10-CM | POA: Diagnosis not present

## 2021-07-10 DIAGNOSIS — H25813 Combined forms of age-related cataract, bilateral: Secondary | ICD-10-CM | POA: Diagnosis not present

## 2021-07-10 DIAGNOSIS — H43393 Other vitreous opacities, bilateral: Secondary | ICD-10-CM | POA: Diagnosis not present

## 2021-07-10 DIAGNOSIS — H5703 Miosis: Secondary | ICD-10-CM | POA: Diagnosis not present

## 2021-12-19 DIAGNOSIS — I10 Essential (primary) hypertension: Secondary | ICD-10-CM | POA: Diagnosis not present

## 2021-12-19 DIAGNOSIS — M5440 Lumbago with sciatica, unspecified side: Secondary | ICD-10-CM | POA: Diagnosis not present

## 2021-12-26 DIAGNOSIS — I10 Essential (primary) hypertension: Secondary | ICD-10-CM | POA: Diagnosis not present

## 2021-12-31 DIAGNOSIS — R151 Fecal smearing: Secondary | ICD-10-CM | POA: Diagnosis not present

## 2022-01-22 DIAGNOSIS — D2371 Other benign neoplasm of skin of right lower limb, including hip: Secondary | ICD-10-CM | POA: Diagnosis not present

## 2022-01-22 DIAGNOSIS — D1721 Benign lipomatous neoplasm of skin and subcutaneous tissue of right arm: Secondary | ICD-10-CM | POA: Diagnosis not present

## 2022-01-22 DIAGNOSIS — L814 Other melanin hyperpigmentation: Secondary | ICD-10-CM | POA: Diagnosis not present

## 2022-01-22 DIAGNOSIS — L821 Other seborrheic keratosis: Secondary | ICD-10-CM | POA: Diagnosis not present

## 2022-01-22 DIAGNOSIS — D1722 Benign lipomatous neoplasm of skin and subcutaneous tissue of left arm: Secondary | ICD-10-CM | POA: Diagnosis not present

## 2022-01-22 DIAGNOSIS — Z85828 Personal history of other malignant neoplasm of skin: Secondary | ICD-10-CM | POA: Diagnosis not present

## 2022-03-10 DIAGNOSIS — L299 Pruritus, unspecified: Secondary | ICD-10-CM | POA: Diagnosis not present

## 2022-03-15 DIAGNOSIS — S61052A Open bite of left thumb without damage to nail, initial encounter: Secondary | ICD-10-CM | POA: Diagnosis not present

## 2022-03-15 DIAGNOSIS — W540XXA Bitten by dog, initial encounter: Secondary | ICD-10-CM | POA: Diagnosis not present

## 2022-03-15 DIAGNOSIS — Z23 Encounter for immunization: Secondary | ICD-10-CM | POA: Diagnosis not present

## 2022-03-27 DIAGNOSIS — N1832 Chronic kidney disease, stage 3b: Secondary | ICD-10-CM | POA: Diagnosis not present

## 2022-03-27 DIAGNOSIS — H16001 Unspecified corneal ulcer, right eye: Secondary | ICD-10-CM | POA: Diagnosis not present

## 2022-03-27 DIAGNOSIS — E559 Vitamin D deficiency, unspecified: Secondary | ICD-10-CM | POA: Diagnosis not present

## 2022-03-27 DIAGNOSIS — I10 Essential (primary) hypertension: Secondary | ICD-10-CM | POA: Diagnosis not present

## 2022-03-27 DIAGNOSIS — E7800 Pure hypercholesterolemia, unspecified: Secondary | ICD-10-CM | POA: Diagnosis not present

## 2022-03-27 DIAGNOSIS — Z0000 Encounter for general adult medical examination without abnormal findings: Secondary | ICD-10-CM | POA: Diagnosis not present

## 2022-03-27 DIAGNOSIS — M109 Gout, unspecified: Secondary | ICD-10-CM | POA: Diagnosis not present

## 2022-03-27 DIAGNOSIS — I700 Atherosclerosis of aorta: Secondary | ICD-10-CM | POA: Diagnosis not present

## 2022-03-27 DIAGNOSIS — D692 Other nonthrombocytopenic purpura: Secondary | ICD-10-CM | POA: Diagnosis not present

## 2022-03-27 DIAGNOSIS — Z79899 Other long term (current) drug therapy: Secondary | ICD-10-CM | POA: Diagnosis not present

## 2022-04-01 DIAGNOSIS — H16001 Unspecified corneal ulcer, right eye: Secondary | ICD-10-CM | POA: Diagnosis not present

## 2022-04-07 DIAGNOSIS — Z1231 Encounter for screening mammogram for malignant neoplasm of breast: Secondary | ICD-10-CM | POA: Diagnosis not present

## 2022-04-14 DIAGNOSIS — R928 Other abnormal and inconclusive findings on diagnostic imaging of breast: Secondary | ICD-10-CM | POA: Diagnosis not present

## 2022-04-16 DIAGNOSIS — K921 Melena: Secondary | ICD-10-CM | POA: Diagnosis not present

## 2022-04-18 DIAGNOSIS — R195 Other fecal abnormalities: Secondary | ICD-10-CM | POA: Diagnosis not present

## 2022-04-18 DIAGNOSIS — K625 Hemorrhage of anus and rectum: Secondary | ICD-10-CM | POA: Diagnosis not present

## 2022-04-18 DIAGNOSIS — K5900 Constipation, unspecified: Secondary | ICD-10-CM | POA: Diagnosis not present

## 2022-07-03 DIAGNOSIS — K5909 Other constipation: Secondary | ICD-10-CM | POA: Diagnosis not present

## 2022-07-06 DIAGNOSIS — L821 Other seborrheic keratosis: Secondary | ICD-10-CM | POA: Diagnosis not present

## 2022-07-06 DIAGNOSIS — M25472 Effusion, left ankle: Secondary | ICD-10-CM | POA: Diagnosis not present

## 2022-07-06 DIAGNOSIS — L03116 Cellulitis of left lower limb: Secondary | ICD-10-CM | POA: Diagnosis not present

## 2022-07-06 DIAGNOSIS — M25572 Pain in left ankle and joints of left foot: Secondary | ICD-10-CM | POA: Diagnosis not present

## 2022-07-16 DIAGNOSIS — H43393 Other vitreous opacities, bilateral: Secondary | ICD-10-CM | POA: Diagnosis not present

## 2022-07-16 DIAGNOSIS — H02834 Dermatochalasis of left upper eyelid: Secondary | ICD-10-CM | POA: Diagnosis not present

## 2022-07-16 DIAGNOSIS — H04123 Dry eye syndrome of bilateral lacrimal glands: Secondary | ICD-10-CM | POA: Diagnosis not present

## 2022-07-16 DIAGNOSIS — H5703 Miosis: Secondary | ICD-10-CM | POA: Diagnosis not present

## 2022-07-16 DIAGNOSIS — H02831 Dermatochalasis of right upper eyelid: Secondary | ICD-10-CM | POA: Diagnosis not present

## 2022-07-16 DIAGNOSIS — H25813 Combined forms of age-related cataract, bilateral: Secondary | ICD-10-CM | POA: Diagnosis not present

## 2022-07-16 DIAGNOSIS — H35363 Drusen (degenerative) of macula, bilateral: Secondary | ICD-10-CM | POA: Diagnosis not present

## 2022-07-18 ENCOUNTER — Encounter (HOSPITAL_COMMUNITY): Payer: Self-pay

## 2022-07-18 ENCOUNTER — Ambulatory Visit (HOSPITAL_COMMUNITY)
Admission: EM | Admit: 2022-07-18 | Discharge: 2022-07-18 | Disposition: A | Discharge status: Home / Self Care | Payer: Medicare PPO

## 2022-07-18 DIAGNOSIS — R229 Localized swelling, mass and lump, unspecified: Secondary | ICD-10-CM

## 2022-07-18 DIAGNOSIS — L821 Other seborrheic keratosis: Secondary | ICD-10-CM

## 2022-07-18 MED ORDER — HYDROXYZINE HCL 25 MG PO TABS: 25.0000 mg | ORAL_TABLET | Freq: Four times a day (QID) | ORAL | 0 refills | Status: AC

## 2022-07-18 NOTE — ED Triage Notes (Signed)
Rash onset 3-5 days. Started on the upper torso and spread to the legs. No new products, no dietary or medication changes. No one with a similar rash.

## 2022-07-18 NOTE — Discharge Instructions (Addendum)
You appear to have some subcutaneous nodules as well as seborrheic keratosis.  Please ensure you are using a unscented soap like Dove sensitive skin and a lubricating lotion like Lubriderm you can also use Aquaphor or Vaseline.  You can take the hydroxyzine to help with the itching, this may cause sedation to do not drink or drive on this medication.  Please follow-up with dermatology for your skin issues and your primary care provider if your leg issues persist.

## 2022-07-18 NOTE — ED Provider Notes (Signed)
[facility]    CSN: [id number] Arrival date & time: 07/18/22  8295      History   Chief Complaint Chief Complaint  Patient presents with   Rash    HPI Elizabeth Beard is a 82 y.o. female.   Patient reports ongoing rash for the past week. Small red lesions to her arms, burning in her legs in the mornings and multiple stuck on dark lesions throughout her body, neck, abdomen, breasts, groin, and trunk.  Patient denies any changes to soaps or detergent.  She has tried lotion for the rash.  She reports it is sometimes itchy, not itchy currently.  Has been seen multiple times for pruritus, solar lesions, seborrheic keratosis, cherry hemangioma and other skin conditions. She does have a dermatologist.    The history is provided by the patient and medical records.  Rash   Past Medical History:  Diagnosis Date   Hypertension     There are no problems to display for this patient.   Past Surgical History:  Procedure Laterality Date   ABDOMINAL HYSTERECTOMY     CHOLECYSTECTOMY      OB History   No obstetric history on file.      Home Medications    Prior to Admission medications   Medication Sig Start Date End Date Taking? Authorizing Provider  allopurinol (ZYLOPRIM) 300 MG tablet Take 300 mg by mouth daily.   Yes [provider]  aspirin EC 81 MG tablet Take 81 mg by mouth daily.   Yes [provider]  CALCIUM CITRATE-VITAMIN D PO Take 1 tablet by mouth 2 (two) times daily.   Yes [provider]  Cholecalciferol (VITAMIN D3) 25 MCG (1000 UT) CAPS 1 capsule Orally Once a day for 30 day(s)   Yes [provider]  hydrOXYzine (ATARAX) 25 MG tablet Take 1 tablet (25 mg total) by mouth every 6 (six) hours. 07/18/22  Yes Rinaldo Ratel, [place] N, FNP  labetalol (NORMODYNE) 300 MG tablet Take 300 mg by mouth 2 (two) times daily.   Yes [provider]  Multiple Vitamin (MULTI-VITAMIN) tablet Take by mouth. 11/24/20  Yes [provider]  potassium chloride SA (K-DUR) 20 MEQ tablet Take 20 mEq by mouth 2 (two) times daily.   Yes [provider]  valsartan-hydrochlorothiazide (DIOVAN-HCT) 320-25 MG tablet Take 1 tablet by mouth daily.   Yes [provider]    Family History Family History  Problem Relation Age of Onset   Cancer Mother    Heart failure Father     Social History Social History   Tobacco Use   Smoking status: Never   Smokeless tobacco: Never  Vaping Use   Vaping Use: Never used  Substance Use Topics   Alcohol use: Not Currently   Drug use: Never     Allergies   Patient has no known allergies.   Review of Systems Review of Systems  Skin:  Positive for rash.     Physical Exam Triage Vital Signs ED Triage Vitals  Enc Vitals Group     BP 07/18/22 0847 104/71     Pulse Rate 07/18/22 0847 69     Resp 07/18/22 0847 18     Temp 07/18/22 0847 97.9 F (36.6 C)     Temp Source 07/18/22 0847 Oral     SpO2 07/18/22 0847 97 %     Weight 07/18/22 0847 132 lb (59.9 kg)     Height 07/18/22 0847 5' (1.524 m)  Head Circumference --      Peak Flow --      Pain Score 07/18/22 0844 5     Pain Loc --      Pain Edu? --      Excl. in GC? --    No data found.  Updated Vital Signs BP 104/71 (BP Location: Left Arm)   Pulse 69   Temp 97.9 F (36.6 C) (Oral)   Resp 18   Ht 5' (1.524 m)   Wt 132 lb (59.9 kg)   SpO2 97%   BMI 25.78 kg/m   Visual Acuity Right Eye Distance:   Left Eye Distance:   Bilateral Distance:    Right Eye Near:   Left Eye Near:    Bilateral Near:     Physical Exam Vitals and nursing note reviewed.  Constitutional:      Appearance: Normal appearance.  HENT:     Head: Normocephalic and atraumatic.     Right Ear: External ear normal.     Left Ear: External ear normal.     Nose: Nose normal.     Mouth/Throat:     Mouth: Mucous membranes are moist.  Eyes:     Conjunctiva/sclera: Conjunctivae normal.  Cardiovascular:     Rate  and Rhythm: Normal rate.  Pulmonary:     Effort: Pulmonary effort is normal. No respiratory distress.  Musculoskeletal:        General: Normal range of motion.  Skin:    General: Skin is warm and dry.     Findings: Lesion present.     Comments: Seborrheic keratosis scattered throughout her body with cherry hemangiomas on her arms, solar lentigo on arms and face.   Neurological:     General: No focal deficit present.     Mental Status: She is alert.  Psychiatric:        Mood and Affect: Mood normal.        Behavior: Behavior normal. Behavior is cooperative.      UC Treatments / Results  Labs (all labs ordered are listed, but only abnormal results are displayed) Labs Reviewed - No data to display  EKG   Radiology No results found.  Procedures Procedures (including critical care time)  Medications Ordered in UC Medications - No data to display  Initial Impression / Assessment and Plan / UC Course  I have reviewed the triage vital signs and the nursing notes.  Pertinent labs & imaging results that were available during my care of the patient were reviewed by me and considered in my medical decision making (see chart for details).  Vitals and triage reviewed, patient is hemodynamically stable.  Multiple seborrheic keratosis scattered throughout her body, reports this started last week, documented history of same.  Jerry hemangiomas on arms, solar lentigo on arms and face.  Lesions are not pruritic, advised lubricating lotion or ointment and dermatology follow-up.  No indication for steroids at this time.  Does have some itching and burning to her legs, no obvious lesions.  Trial as needed hydroxyzine.  Patient verbalized understanding, no questions at this time.     Final Clinical Impressions(s) / UC Diagnoses   Final diagnoses:  Subcutaneous nodules  Seborrheic keratosis     Discharge Instructions      You appear to have some subcutaneous nodules as well as  seborrheic keratosis.  Please ensure you are using a unscented soap like Dove sensitive skin and a lubricating lotion like Lubriderm you can also use Aquaphor or  Vaseline.  You can take the hydroxyzine to help with the itching, this may cause sedation to do not drink or drive on this medication.  Please follow-up with dermatology for your skin issues and your primary care provider if your leg issues persist.     ED Prescriptions     Medication Sig Dispense Auth. Provider   hydrOXYzine (ATARAX) 25 MG tablet Take 1 tablet (25 mg total) by mouth every 6 (six) hours. 12 tablet Cordelia Bessinger, [place] N, [place]      PDMP not reviewed this encounter.   Rinaldo Ratel [place] N, [place] 07/18/22 [id number]

## 2022-08-22 DIAGNOSIS — R21 Rash and other nonspecific skin eruption: Secondary | ICD-10-CM | POA: Diagnosis not present

## 2022-09-04 DIAGNOSIS — H25812 Combined forms of age-related cataract, left eye: Secondary | ICD-10-CM | POA: Diagnosis not present

## 2022-09-04 DIAGNOSIS — H268 Other specified cataract: Secondary | ICD-10-CM | POA: Diagnosis not present

## 2022-09-06 DIAGNOSIS — L821 Other seborrheic keratosis: Secondary | ICD-10-CM | POA: Diagnosis not present

## 2022-09-06 DIAGNOSIS — K5900 Constipation, unspecified: Secondary | ICD-10-CM | POA: Diagnosis not present

## 2022-09-15 DIAGNOSIS — Z961 Presence of intraocular lens: Secondary | ICD-10-CM | POA: Diagnosis not present

## 2022-09-15 DIAGNOSIS — H25811 Combined forms of age-related cataract, right eye: Secondary | ICD-10-CM | POA: Diagnosis not present

## 2022-09-18 DIAGNOSIS — H268 Other specified cataract: Secondary | ICD-10-CM | POA: Diagnosis not present

## 2022-09-18 DIAGNOSIS — H2511 Age-related nuclear cataract, right eye: Secondary | ICD-10-CM | POA: Diagnosis not present

## 2022-09-29 ENCOUNTER — Emergency Department (HOSPITAL_COMMUNITY)
Admission: EM | Admit: 2022-09-29 | Discharge: 2022-09-29 | Disposition: A | Discharge status: Home / Self Care | Payer: Medicare PPO | Attending: Emergency Medicine | Admitting: Emergency Medicine

## 2022-09-29 ENCOUNTER — Emergency Department (HOSPITAL_COMMUNITY): Payer: Medicare PPO

## 2022-09-29 ENCOUNTER — Encounter (HOSPITAL_COMMUNITY): Payer: Self-pay

## 2022-09-29 ENCOUNTER — Other Ambulatory Visit: Payer: Self-pay

## 2022-09-29 DIAGNOSIS — Z79899 Other long term (current) drug therapy: Secondary | ICD-10-CM | POA: Diagnosis not present

## 2022-09-29 DIAGNOSIS — Z1152 Encounter for screening for COVID-19: Secondary | ICD-10-CM | POA: Diagnosis not present

## 2022-09-29 DIAGNOSIS — L989 Disorder of the skin and subcutaneous tissue, unspecified: Secondary | ICD-10-CM | POA: Diagnosis not present

## 2022-09-29 DIAGNOSIS — R229 Localized swelling, mass and lump, unspecified: Secondary | ICD-10-CM

## 2022-09-29 DIAGNOSIS — N289 Disorder of kidney and ureter, unspecified: Secondary | ICD-10-CM | POA: Diagnosis not present

## 2022-09-29 DIAGNOSIS — Z7982 Long term (current) use of aspirin: Secondary | ICD-10-CM | POA: Diagnosis not present

## 2022-09-29 DIAGNOSIS — K769 Liver disease, unspecified: Secondary | ICD-10-CM | POA: Diagnosis not present

## 2022-09-29 DIAGNOSIS — R7989 Other specified abnormal findings of blood chemistry: Secondary | ICD-10-CM | POA: Diagnosis not present

## 2022-09-29 DIAGNOSIS — I700 Atherosclerosis of aorta: Secondary | ICD-10-CM | POA: Diagnosis not present

## 2022-09-29 DIAGNOSIS — I10 Essential (primary) hypertension: Secondary | ICD-10-CM | POA: Diagnosis not present

## 2022-09-29 DIAGNOSIS — K838 Other specified diseases of biliary tract: Secondary | ICD-10-CM | POA: Diagnosis not present

## 2022-09-29 LAB — CBC WITH DIFFERENTIAL/PLATELET
Abs Immature Granulocytes: 0.01 10*3/uL (ref 0.00–0.07)
Basophils Absolute: 0 10*3/uL (ref 0.0–0.1)
Basophils Relative: 0 %
Eosinophils Absolute: 0 10*3/uL (ref 0.0–0.5)
Eosinophils Relative: 0 %
HCT: 40.5 % (ref 36.0–46.0)
Hemoglobin: 13.6 g/dL (ref 12.0–15.0)
Immature Granulocytes: 0 %
Lymphocytes Relative: 19 %
Lymphs Abs: 1 10*3/uL (ref 0.7–4.0)
MCH: 30.5 pg (ref 26.0–34.0)
MCHC: 33.6 g/dL (ref 30.0–36.0)
MCV: 90.8 fL (ref 80.0–100.0)
Monocytes Absolute: 0.3 10*3/uL (ref 0.1–1.0)
Monocytes Relative: 6 %
Neutro Abs: 4.1 10*3/uL (ref 1.7–7.7)
Neutrophils Relative %: 75 %
Platelets: 207 10*3/uL (ref 150–400)
RBC: 4.46 MIL/uL (ref 3.87–5.11)
RDW: 14.3 % (ref 11.5–15.5)
WBC: 5.5 10*3/uL (ref 4.0–10.5)
nRBC: 0 % (ref 0.0–0.2)

## 2022-09-29 LAB — COMPREHENSIVE METABOLIC PANEL
ALT: 17 U/L (ref 0–44)
AST: 18 U/L (ref 15–41)
Albumin: 3.9 g/dL (ref 3.5–5.0)
Alkaline Phosphatase: 51 U/L (ref 38–126)
Anion gap: 9 (ref 5–15)
BUN: 18 mg/dL (ref 8–23)
CO2: 27 mmol/L (ref 22–32)
Calcium: 9.5 mg/dL (ref 8.9–10.3)
Chloride: 100 mmol/L (ref 98–111)
Creatinine, Ser: 1.21 mg/dL — ABNORMAL HIGH (ref 0.44–1.00)
GFR, Estimated: 44 mL/min — ABNORMAL LOW (ref >60)
Glucose, Bld: 89 mg/dL (ref 70–99)
Potassium: 3.8 mmol/L (ref 3.5–5.1)
Sodium: 136 mmol/L (ref 135–145)
Total Bilirubin: 0.5 mg/dL (ref 0.3–1.2)
Total Protein: 6.8 g/dL (ref 6.5–8.1)

## 2022-09-29 LAB — TSH: TSH: 2.634 u[IU]/mL (ref 0.350–4.500)

## 2022-09-29 LAB — SARS CORONAVIRUS 2 BY RT PCR: SARS Coronavirus 2 by RT PCR: NEGATIVE

## 2022-09-29 MED ORDER — IOHEXOL 300 MG/ML  SOLN
100.0000 mL | Freq: Once | INTRAMUSCULAR | Status: AC | PRN
  Administered 2022-09-29: 80 mL via INTRAVENOUS

## 2022-09-29 NOTE — ED Notes (Signed)
Urine sent to Lab

## 2022-09-29 NOTE — ED Triage Notes (Addendum)
Patient began noticing raised bumps (pea size) on both arms, legs, abdomen 3 weeks ago. Do not hurt or itch. When she wakes up in the morning it stings but then it resolves after a few minutes.

## 2022-09-29 NOTE — ED Provider Notes (Signed)
[place] [facility] AT [facility] Provider Note   CSN: [id number] Arrival date & time: 09/29/22  4098     History {Add pertinent medical, surgical, social history, OB history to HPI:1} Chief Complaint  Patient presents with   Lumps on Body    Elizabeth Beard is a 82 y.o. female.  Pt is a 82 yo female with pmhx significant for htn.  Pt has noticed several "lumps" that have formed on her arms, legs and abdomen.  Pt said she may have a brief "sting" in the am when she wakes up, but otherwise, they are non tender.  Pt said they are not going away and are starting to worry her.  Pt denies any other sx.  No redness, no swelling, no fevers/chills.        Home Medications Prior to Admission medications   Medication Sig Start Date End Date Taking? Authorizing Provider  allopurinol (ZYLOPRIM) 300 MG tablet Take 300 mg by mouth daily.    [provider]  aspirin EC 81 MG tablet Take 81 mg by mouth daily.    [provider]  CALCIUM CITRATE-VITAMIN D PO Take 1 tablet by mouth 2 (two) times daily.    [provider]  Cholecalciferol (VITAMIN D3) 25 MCG (1000 UT) CAPS 1 capsule Orally Once a day for 30 day(s)    [provider]  hydrOXYzine (ATARAX) 25 MG tablet Take 1 tablet (25 mg total) by mouth every 6 (six) hours. 07/18/22   Garrison, [place] N, FNP  labetalol (NORMODYNE) 300 MG tablet Take 300 mg by mouth 2 (two) times daily.    [provider]  Multiple Vitamin (MULTI-VITAMIN) tablet Take by mouth. 11/24/20   [provider]  potassium chloride SA (K-DUR) 20 MEQ tablet Take 20 mEq by mouth 2 (two) times daily.    [provider]  valsartan-hydrochlorothiazide (DIOVAN-HCT) 320-25 MG tablet Take 1 tablet by mouth daily.    [provider]      Allergies    Patient has no known allergies.    Review of Systems   Review of Systems  Skin:  Positive for rash.  All other systems reviewed and are  negative.   Physical Exam Updated Vital Signs BP 132/74 (BP Location: Right Arm)   Pulse 67   Temp 97.8 F (36.6 C) (Oral)   Resp 17   Ht 5' (1.524 m)   Wt 63.5 kg   SpO2 99%   BMI 27.34 kg/m  Physical Exam Vitals and nursing note reviewed.  Constitutional:      Appearance: Normal appearance.  HENT:     Head: Normocephalic and atraumatic.     Right Ear: External ear normal.     Left Ear: External ear normal.     Nose: Nose normal.     Mouth/Throat:     Mouth: Mucous membranes are moist.     Pharynx: Oropharynx is clear.  Eyes:     Extraocular Movements: Extraocular movements intact.     Conjunctiva/sclera: Conjunctivae normal.     Pupils: Pupils are equal, round, and reactive to light.  Cardiovascular:     Rate and Rhythm: Normal rate.  Pulmonary:     Effort: Pulmonary effort is normal.     Breath sounds: Normal breath sounds.  Abdominal:     General: Abdomen is flat. Bowel sounds are normal.  Musculoskeletal:        General: Normal range of motion.     Cervical back: Normal  range of motion.  Skin:    General: Skin is warm and dry.     Capillary Refill: Capillary refill takes less than 2 seconds.     Comments: Pt does have several nodules on arms/legs/abd.  They are skin colored.  No crusting, scaling, vesicles, pustules, abscesses.  They are non tender.    Neurological:     General: No focal deficit present.     Mental Status: She is alert and oriented to person, place, and time.  Psychiatric:        Mood and Affect: Mood normal.        Behavior: Behavior normal.     ED Results / Procedures / Treatments   Labs (all labs ordered are listed, but only abnormal results are displayed) Labs Reviewed  COMPREHENSIVE METABOLIC PANEL - Abnormal; Notable for the following components:      Result Value   Creatinine, Ser 1.21 (*)    GFR, Estimated 44 (*)    All other components within normal limits  SARS CORONAVIRUS 2 BY RT PCR  CBC WITH DIFFERENTIAL/PLATELET  TSH   LUPUS ANTICOAGULANT PANEL  BARTONELLA ANTIBODY PANEL  LYME DISEASE SEROLOGY W/REFLEX  RHEUMATOID FACTOR    EKG None  Radiology No results found.  Procedures Procedures  {Document cardiac monitor, telemetry assessment procedure when appropriate:1}  Medications Ordered in ED Medications  iohexol (OMNIPAQUE) 300 MG/ML solution 100 mL (80 mLs Intravenous Contrast Given 09/29/22 1154)    ED Course/ Medical Decision Making/ A&P   {   Click here for ABCD2, HEART and other calculatorsREFRESH Note before signing :1}                              Medical Decision Making Amount and/or Complexity of Data Reviewed Labs: ordered. Radiology: ordered.  Risk Prescription drug management.   This patient presents to the ED for concern of multiple skin lesions, this involves an extensive number of treatment options, and is a complaint that carries with it a high risk of complications and morbidity.  The differential diagnosis includes hypothyroidism, anemia, electrolyte abn, cat scratch, lyme, rmsf, lupus, ra   Co morbidities that complicate the patient evaluation  htn   Additional history obtained:  Additional history obtained from epic chart review  Lab Tests:  I Ordered, and personally interpreted labs.  The pertinent results include:  cbc nl, cmp with mild cr elevation at 1.21, covid neg   Imaging Studies ordered:  I ordered imaging studies including ct chest/abd/pelvis  I independently visualized and interpreted imaging which showed *** I agree with the radiologist interpretation   Cardiac Monitoring:  The patient was maintained on a cardiac monitor.  I personally viewed and interpreted the cardiac monitored which showed an underlying rhythm of: nsr   Medicines ordered and prescription drug management:  I ordered medication including ***  for ***  Reevaluation of the patient after these medicines showed that the patient  {resolved/improved/worsened:23923::"improved"} I have reviewed the patients home medicines and have made adjustments as needed   Test Considered:  ct   Critical Interventions:  ***   Consultations Obtained:  I requested consultation with the ***,  and discussed lab and imaging findings as well as pertinent plan - they recommend: ***   Problem List / ED Course:  ***   Reevaluation:  After the interventions noted above, I reevaluated the patient and found that they have :improved   Social Determinants of Health:  Multiple nodules   Dispostion:  After consideration of the diagnostic results and the patients response to treatment, I feel that the patent would benefit from ***.    {Document critical care time when appropriate:1} {Document review of labs and clinical decision tools ie heart score, Chads2Vasc2 etc:1}  {Document your independent review of radiology images, and any outside records:1} {Document your discussion with family members, caretakers, and with consultants:1} {Document social determinants of health affecting pt's care:1} {Document your decision making why or why not admission, treatments were needed:1} Final Clinical Impression(s) / ED Diagnoses Final diagnoses:  Multiple skin nodules    Rx / DC Orders ED Discharge Orders     None

## 2022-09-30 LAB — LUPUS ANTICOAGULANT PANEL
DRVVT: 33.7 s (ref 0.0–47.0)
PTT Lupus Anticoagulant: 34 s (ref 0.0–43.5)

## 2022-09-30 LAB — LYME DISEASE SEROLOGY W/REFLEX: Lyme Total Antibody EIA: NEGATIVE

## 2022-09-30 LAB — RHEUMATOID FACTOR: Rheumatoid fact SerPl-aCnc: 10.5 [IU]/mL (ref <14.0)

## 2022-10-01 LAB — BARTONELLA ANTIBODY PANEL
B Quintana IgM: NEGATIVE {titer}
B henselae IgG: NEGATIVE titer
B henselae IgM: NEGATIVE {titer}
B quintana IgG: NEGATIVE {titer}

## 2022-10-06 DIAGNOSIS — N1832 Chronic kidney disease, stage 3b: Secondary | ICD-10-CM | POA: Diagnosis not present

## 2022-10-06 DIAGNOSIS — R053 Chronic cough: Secondary | ICD-10-CM | POA: Diagnosis not present

## 2022-10-06 DIAGNOSIS — U071 COVID-19: Secondary | ICD-10-CM | POA: Diagnosis not present

## 2022-10-06 DIAGNOSIS — W19XXXA Unspecified fall, initial encounter: Secondary | ICD-10-CM | POA: Diagnosis not present

## 2022-10-06 DIAGNOSIS — R0981 Nasal congestion: Secondary | ICD-10-CM | POA: Diagnosis not present

## 2022-11-19 DIAGNOSIS — H02031 Senile entropion of right upper eyelid: Secondary | ICD-10-CM | POA: Diagnosis not present

## 2022-11-19 DIAGNOSIS — H0279 Other degenerative disorders of eyelid and periocular area: Secondary | ICD-10-CM | POA: Diagnosis not present

## 2022-11-19 DIAGNOSIS — H02532 Eyelid retraction right lower eyelid: Secondary | ICD-10-CM | POA: Diagnosis not present

## 2022-11-19 DIAGNOSIS — H02011 Cicatricial entropion of right upper eyelid: Secondary | ICD-10-CM | POA: Diagnosis not present

## 2022-11-19 DIAGNOSIS — Z01818 Encounter for other preprocedural examination: Secondary | ICD-10-CM | POA: Diagnosis not present

## 2022-11-19 DIAGNOSIS — H02051 Trichiasis without entropian right upper eyelid: Secondary | ICD-10-CM | POA: Diagnosis not present

## 2022-11-19 DIAGNOSIS — H02021 Mechanical entropion of right upper eyelid: Secondary | ICD-10-CM | POA: Diagnosis not present

## 2022-11-19 DIAGNOSIS — H0234 Blepharochalasis left upper eyelid: Secondary | ICD-10-CM | POA: Diagnosis not present

## 2022-11-19 DIAGNOSIS — H0231 Blepharochalasis right upper eyelid: Secondary | ICD-10-CM | POA: Diagnosis not present

## 2023-01-26 DIAGNOSIS — Z85828 Personal history of other malignant neoplasm of skin: Secondary | ICD-10-CM | POA: Diagnosis not present

## 2023-01-26 DIAGNOSIS — D1721 Benign lipomatous neoplasm of skin and subcutaneous tissue of right arm: Secondary | ICD-10-CM | POA: Diagnosis not present

## 2023-01-26 DIAGNOSIS — D1722 Benign lipomatous neoplasm of skin and subcutaneous tissue of left arm: Secondary | ICD-10-CM | POA: Diagnosis not present

## 2023-01-26 DIAGNOSIS — L821 Other seborrheic keratosis: Secondary | ICD-10-CM | POA: Diagnosis not present

## 2023-01-26 DIAGNOSIS — D2371 Other benign neoplasm of skin of right lower limb, including hip: Secondary | ICD-10-CM | POA: Diagnosis not present

## 2023-01-26 DIAGNOSIS — L814 Other melanin hyperpigmentation: Secondary | ICD-10-CM | POA: Diagnosis not present

## 2023-01-26 DIAGNOSIS — D225 Melanocytic nevi of trunk: Secondary | ICD-10-CM | POA: Diagnosis not present

## 2023-03-05 IMAGING — CR DG SKULL 1-3V
5 series · 5 of 5 positions shown · non-contrast
Comparison: None.

CLINICAL DATA: Palpable abnormality in the frontal region

EXAM:
SKULL - 1-3 VIEW

[[person_name] pa]
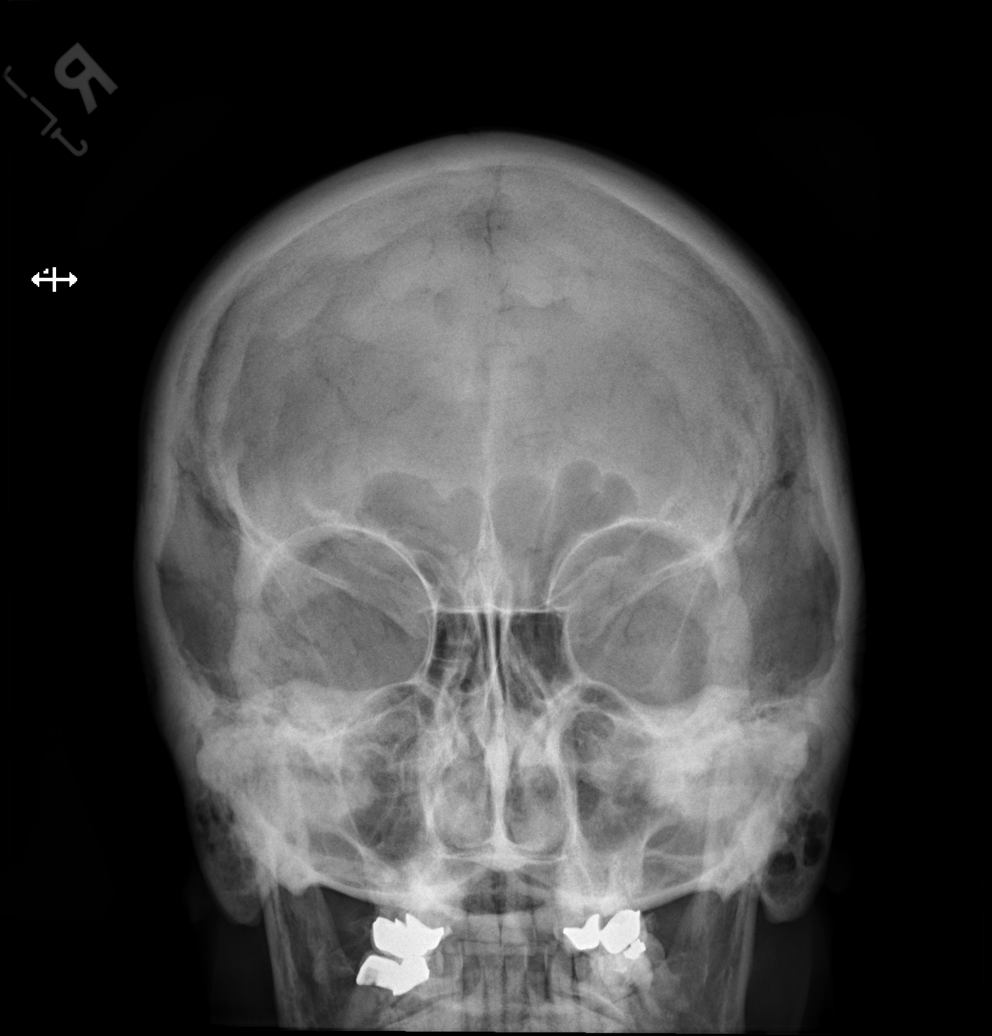

[w skull lat (1 of 4)]
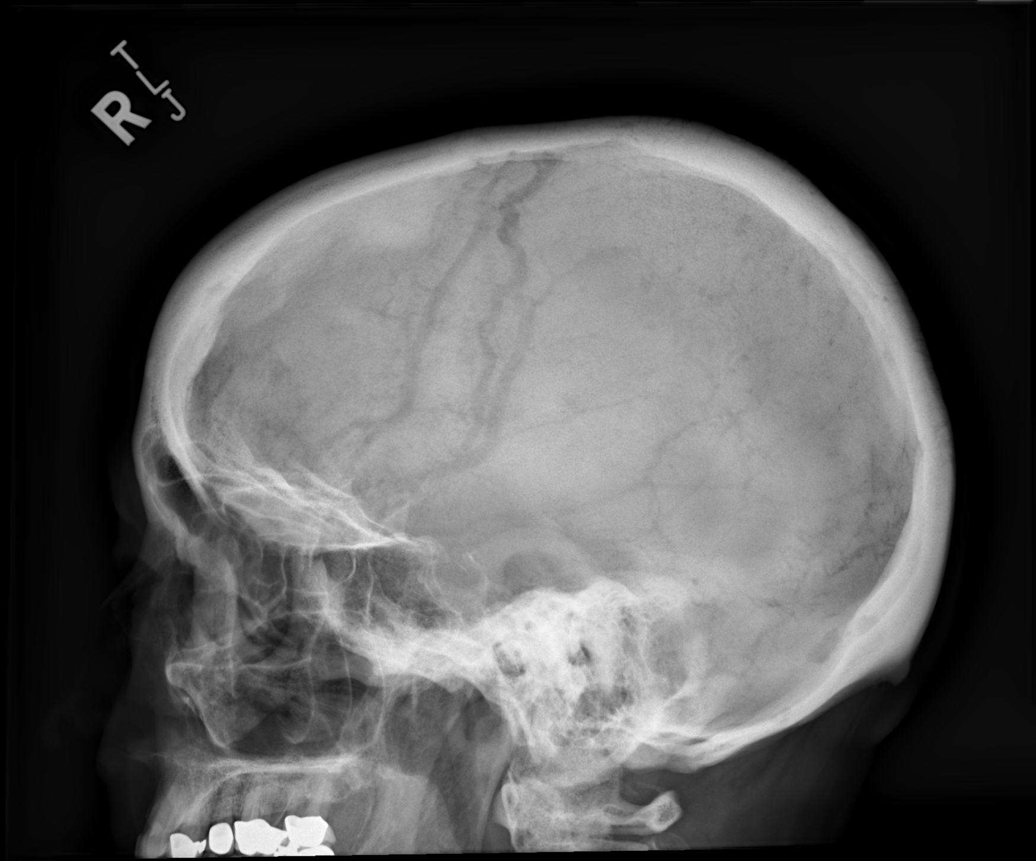

[w skull lat (2 of 4)]
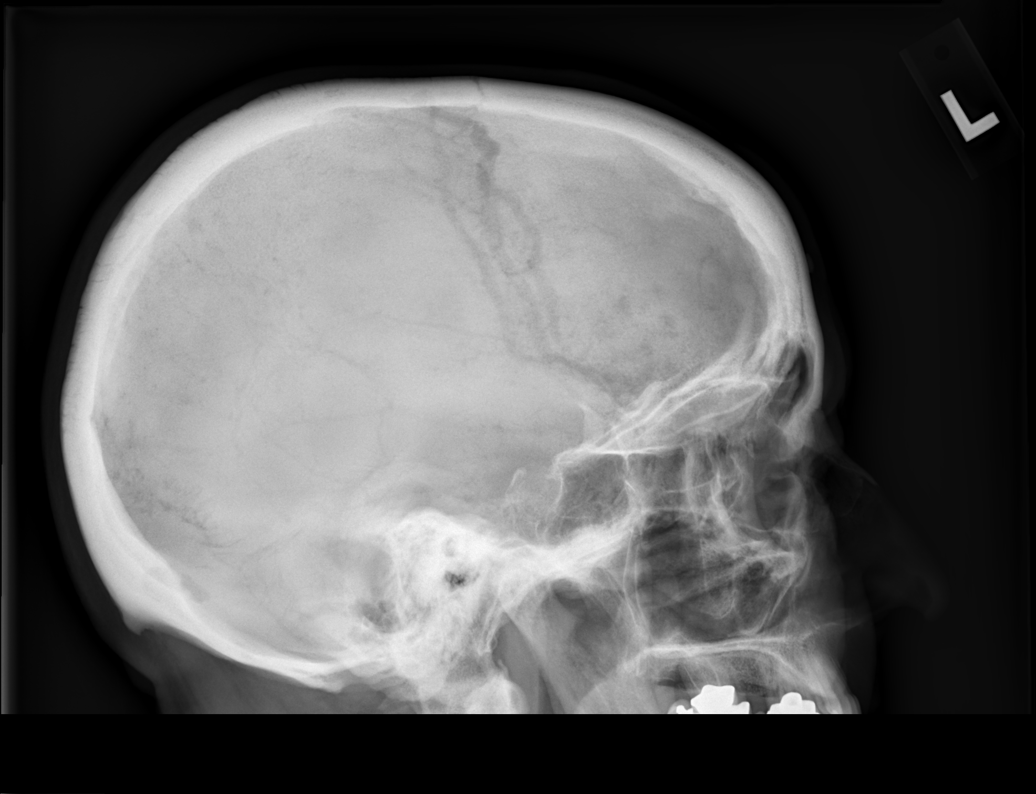

[w skull lat (3 of 4)]
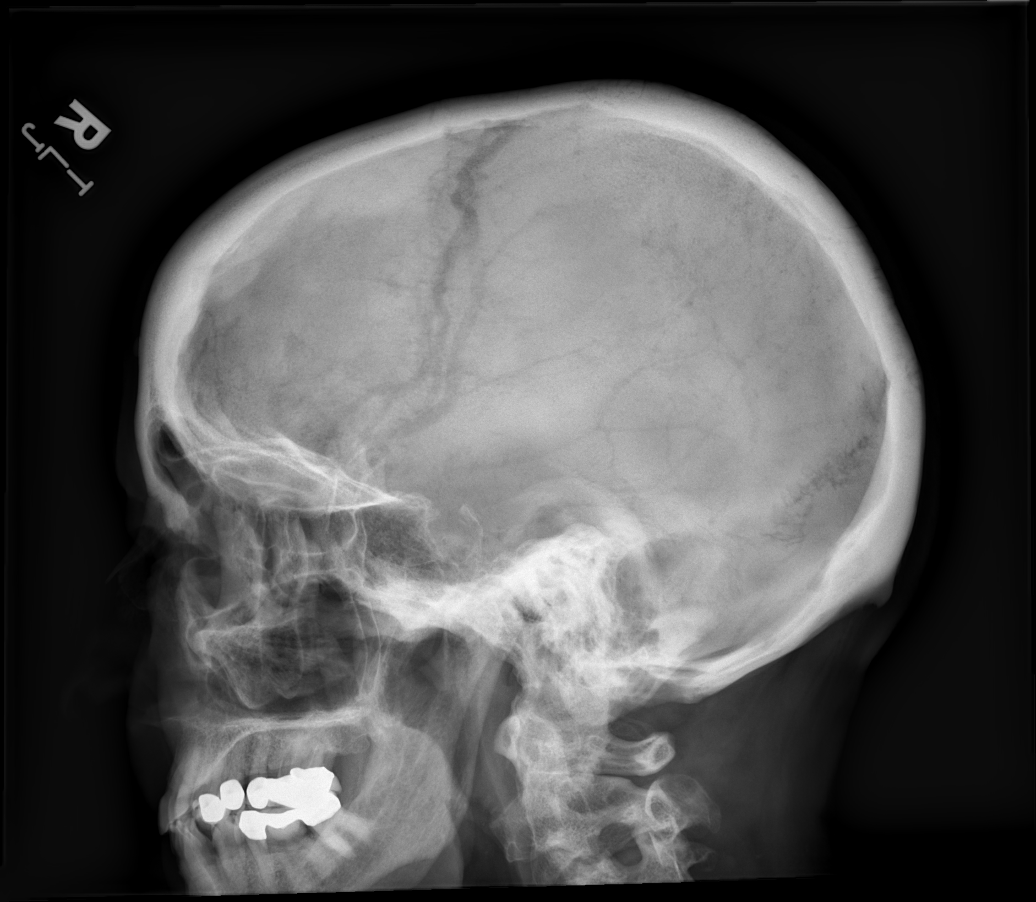

[w skull lat (4 of 4)]
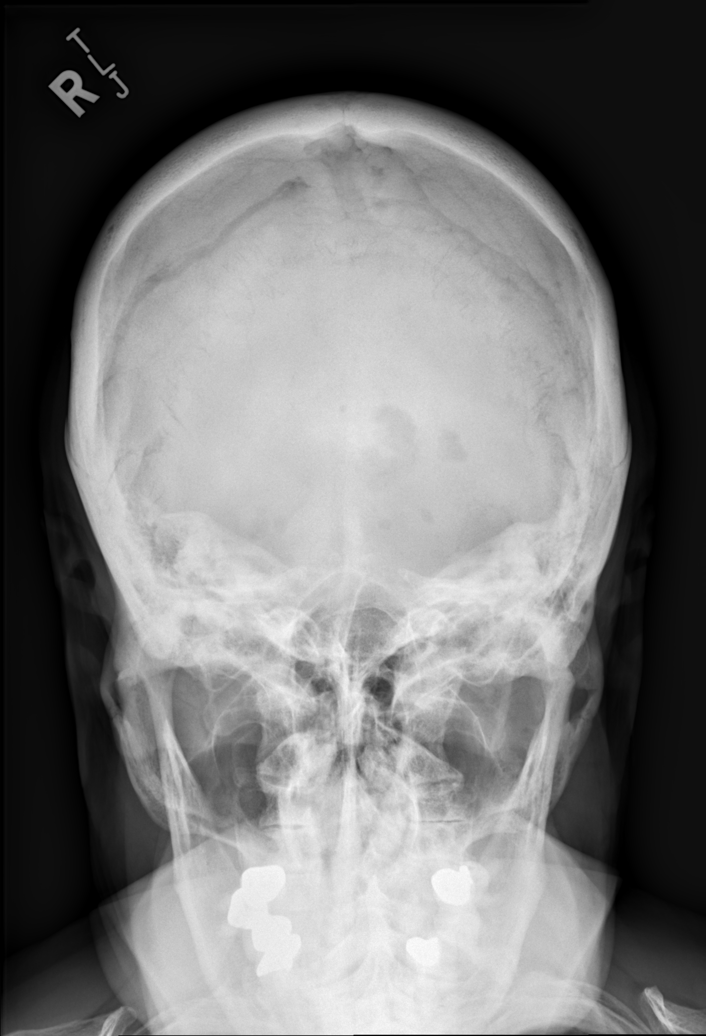

[5 of 5 positions shown; findings below may reference images not displayed]

FINDINGS: No radiographic abnormality is seen in calvarium with attention to
the frontal region. Hyperostosis frontalis interna is seen. In the
lateral views, there is a small linear smooth marginated
calcification measuring proximally 6 x 2 mm calcification in
subcutaneous plane in the frontal scalp. This finding could not be
seen in the rest of the images. No erosive changes are noted in the
cortical margin of the frontal bones adjacent to this soft tissue
calcification. Visualized paranasal sinuses are clear.
IMPRESSION: No fracture or focal lytic lesions are seen in bony calvarium.
Hyperostosis frontalis interna is seen.

There is small linear smooth marginated calcification in the soft
tissues adjacent to the frontal calvarium, possibly soft tissue
calcification from previous trauma or chronic inflammation.

## 2023-04-20 DIAGNOSIS — Z1231 Encounter for screening mammogram for malignant neoplasm of breast: Secondary | ICD-10-CM | POA: Diagnosis not present

## 2023-06-22 DIAGNOSIS — E559 Vitamin D deficiency, unspecified: Secondary | ICD-10-CM | POA: Diagnosis not present

## 2023-06-22 DIAGNOSIS — F331 Major depressive disorder, recurrent, moderate: Secondary | ICD-10-CM | POA: Diagnosis not present

## 2023-06-22 DIAGNOSIS — M109 Gout, unspecified: Secondary | ICD-10-CM | POA: Diagnosis not present

## 2023-06-22 DIAGNOSIS — N1832 Chronic kidney disease, stage 3b: Secondary | ICD-10-CM | POA: Diagnosis not present

## 2023-06-22 DIAGNOSIS — Z0000 Encounter for general adult medical examination without abnormal findings: Secondary | ICD-10-CM | POA: Diagnosis not present

## 2023-06-22 DIAGNOSIS — E8881 Metabolic syndrome: Secondary | ICD-10-CM | POA: Diagnosis not present

## 2023-06-22 DIAGNOSIS — I700 Atherosclerosis of aorta: Secondary | ICD-10-CM | POA: Diagnosis not present

## 2023-06-22 DIAGNOSIS — Z79899 Other long term (current) drug therapy: Secondary | ICD-10-CM | POA: Diagnosis not present

## 2023-06-22 DIAGNOSIS — E7800 Pure hypercholesterolemia, unspecified: Secondary | ICD-10-CM | POA: Diagnosis not present

## 2023-07-22 DIAGNOSIS — E2839 Other primary ovarian failure: Secondary | ICD-10-CM | POA: Diagnosis not present

## 2023-07-22 DIAGNOSIS — M8588 Other specified disorders of bone density and structure, other site: Secondary | ICD-10-CM | POA: Diagnosis not present

## 2023-08-08 ENCOUNTER — Emergency Department (HOSPITAL_COMMUNITY)
Admission: EM | Admit: 2023-08-08 | Discharge: 2023-08-08 | Disposition: A | Discharge status: Home / Self Care | Attending: Emergency Medicine | Admitting: Emergency Medicine

## 2023-08-08 ENCOUNTER — Encounter (HOSPITAL_COMMUNITY): Payer: Self-pay

## 2023-08-08 ENCOUNTER — Other Ambulatory Visit: Payer: Self-pay

## 2023-08-08 DIAGNOSIS — L819 Disorder of pigmentation, unspecified: Secondary | ICD-10-CM | POA: Diagnosis not present

## 2023-08-08 DIAGNOSIS — R233 Spontaneous ecchymoses: Secondary | ICD-10-CM | POA: Insufficient documentation

## 2023-08-08 DIAGNOSIS — Z7982 Long term (current) use of aspirin: Secondary | ICD-10-CM | POA: Diagnosis not present

## 2023-08-08 MED ORDER — HYDROCORTISONE 1 % EX CREA
TOPICAL_CREAM | Freq: Once | CUTANEOUS | Status: AC
  Administered 2023-08-08: 1 via TOPICAL
  Filled 2023-08-08: qty 28

## 2023-08-08 NOTE — ED Triage Notes (Signed)
 PT arrives via POV. Pt is concerned she may have bed bugs. Pt is AxOx4. Denies any other complaints.

## 2023-08-08 NOTE — Discharge Instructions (Addendum)
 As discussed, your skin discoloration appears to be due to bruising rather than an insect bite.  I have provided information for dermatology to follow-up with the like further evaluation.  Otherwise follow-up with your primary care doctor in the next 5 to 7 days.  Get help right away if: You have joint pain. You have a rash. You feel weak or more tired than you normally do. You have neck pain or a headache. You have signs of an anaphylactic reaction. Signs may include: Swelling of your eyes, lips, face, mouth, tongue, or throat. Feeling warm in the face. Itchy, red, swollen areas of skin. Trouble with breathing, talking, or swallowing. Wheezing. Feeling dizzy or light-headed. Fainting. Pain or cramps in your belly. Vomiting or watery poop.

## 2023-08-08 NOTE — ED Provider Notes (Signed)
 [place] EM[facility]GENCY DEPARTMENT AT [facility] Provider Note   CSN: [id number] Arrival date & time: 08/08/23  9296     Patient presents with: Insect Bite   Elizabeth Beard is a 83 y.o. female with a history of hypertension who presents to the ED today for possible bug bites.  Patient reports that she has been noticing areas of discoloration on her forearms when she wakes up in the morning.  Denies any itching or pain at the areas of discoloration.  Areas of discoloration are not raised.  Is concerned about bed bugs but has not noticed any bugs in her bed or house.  She denies bumping her arms on anything. No new detergents, soaps, or lotions.    Prior to Admission medications   Medication Sig Start Date End Date Taking? Authorizing Provider  allopurinol (ZYLOPRIM) 300 MG tablet Take 300 mg by mouth daily.    [provider]  aspirin EC 81 MG tablet Take 81 mg by mouth daily.    [provider]  CALCIUM CITRATE-VITAMIN D PO Take 1 tablet by mouth 2 (two) times daily.    [provider]  Cholecalciferol (VITAMIN D3) 25 MCG (1000 UT) CAPS 1 capsule Orally Once a day for 30 day(s)    [provider]  hydrOXYzine  (ATARAX ) 25 MG tablet Take 1 tablet (25 mg total) by mouth every 6 (six) hours. 07/18/22   Dreama, [place]  N, FNP  labetalol (NORMODYNE) 300 MG tablet Take 300 mg by mouth 2 (two) times daily.    [provider]  Multiple Vitamin (MULTI-VITAMIN) tablet Take by mouth. 11/24/20   [provider]  potassium chloride SA (K-DUR) 20 MEQ tablet Take 20 mEq by mouth 2 (two) times daily.    [provider]  valsartan-hydrochlorothiazide (DIOVAN-HCT) 320-25 MG tablet Take 1 tablet by mouth daily.    [provider]    Allergies: Patient has no known allergies.    Review of Systems  Skin:  Positive for color change.  All other systems reviewed and are negative.   Updated Vital Signs BP (!) 179/93 (BP  Location: Right Arm)   Pulse 70   Temp 97.9 F (36.6 C) (Oral)   Resp 18   Ht 5' 3 (1.6 m)   Wt 63.5 kg   SpO2 99%   BMI 24.80 kg/m   Physical Exam Vitals and nursing note reviewed.  Constitutional:      Appearance: Normal appearance.  HENT:     Head: Normocephalic and atraumatic.     Mouth/Throat:     Mouth: Mucous membranes are moist.   Eyes:     Conjunctiva/sclera: Conjunctivae normal.     Pupils: Pupils are equal, round, and reactive to light.    Cardiovascular:     Rate and Rhythm: Normal rate and regular rhythm.     Pulses: Normal pulses.     Heart sounds: Normal heart sounds.  Pulmonary:     Effort: Pulmonary effort is normal.     Breath sounds: Normal breath sounds.  Abdominal:     Palpations: Abdomen is soft.     Tenderness: There is no abdominal tenderness.   Skin:    General: Skin is warm and dry.     Findings: Bruising present. No rash.     Comments: Areas of blue/brown discoloration on dorsal forearms bilaterally, appears to be bruising. Areas are not raised, erythematous, or tender to touch.   Neurological:     General: No focal deficit  present.     Mental Status: She is alert.     Sensory: No sensory deficit.     Motor: No weakness.   Psychiatric:        Mood and Affect: Mood normal.        Behavior: Behavior normal.       (all labs ordered are listed, but only abnormal results are displayed) Labs Reviewed - No data to display  EKG: None  Radiology: No results found.   Procedures   Medications Ordered in the ED  hydrocortisone cream 1 % (has no administration in time range)                                    Medical Decision Making  This patient presents to the ED for concern of skin discoloration, this involves an extensive number of treatment options, and is a complaint that carries with it a high risk of complications and morbidity.   Differential diagnosis includes: Contusion, atopic dermatitis, contact dermatitis, Low  suspicion for anaphylactic allergic reaction - no shortness of breath, throat closing sensation, or difficulty swallowing secretions.   Comorbidities  See HPI above   Additional History  Additional history obtained from prior records   Problem List / ED Course / Critical Interventions / Medication Management  Patient presents to the ED today for skin discoloration.  She is concerned about bedbugs.  States that for the past couple days she has been waking up with blue/brown discoloration of her skin.  Appears to be contusions.  I asked patient if she has numbed her arms or anything as when you get older your skin thin you are more stopped old bruising.  She denies bumping into anything. She is not on any blood thinners. States she wanted to come here so someone could check it out for her. The areas that she is concerned about are not red, raised, itchy, or painful.  She has not noticed any bugs in her bed.  No new lotions, detergents, or soaps.  Low suspicion for allergic reaction.  Low suspicion for insect bite. Patient is requesting some sort of cream to help with her skin discoloration. Hydrocortisone given, even though I told her it is self-limiting. She is very concerned about the dislocation despite reassurance, information given for dermatology to follow up with for reevaluation.   Social Determinants of Health  Health literacy   Test / Admission - Considered  Patient is stable and safe for discharge home. Return precautions given.    Final diagnoses:  Discoloration of skin  Easy bruising    ED Discharge Orders     None          Waddell Sluder, PA-C 08/08/23 9247    Lenor Hollering, MD 08/08/23 [id number]

## 2023-08-10 DIAGNOSIS — L819 Disorder of pigmentation, unspecified: Secondary | ICD-10-CM | POA: Diagnosis not present

## 2023-08-25 DIAGNOSIS — B078 Other viral warts: Secondary | ICD-10-CM | POA: Diagnosis not present

## 2023-08-25 DIAGNOSIS — Z85828 Personal history of other malignant neoplasm of skin: Secondary | ICD-10-CM | POA: Diagnosis not present

## 2023-08-25 DIAGNOSIS — L821 Other seborrheic keratosis: Secondary | ICD-10-CM | POA: Diagnosis not present

## 2023-08-25 DIAGNOSIS — D692 Other nonthrombocytopenic purpura: Secondary | ICD-10-CM | POA: Diagnosis not present
